# Patient Record
Sex: Male | Born: 1935 | Race: White | Hispanic: No | Marital: Married
Health system: Southern US, Community
[De-identification: ages and names within clinical notes are randomized; demographics above are authoritative.]

---

## 2006-10-20 ENCOUNTER — Ambulatory Visit: Payer: Self-pay | Admitting: Gastroenterology

## 2007-10-02 ENCOUNTER — Ambulatory Visit: Payer: Self-pay | Admitting: Family Medicine

## 2008-02-05 ENCOUNTER — Ambulatory Visit: Payer: Self-pay | Admitting: Cardiology

## 2008-04-12 ENCOUNTER — Ambulatory Visit: Payer: Self-pay | Admitting: Specialist

## 2008-09-05 ENCOUNTER — Encounter: Payer: Self-pay | Admitting: Specialist

## 2008-09-15 ENCOUNTER — Encounter: Payer: Self-pay | Admitting: Specialist

## 2008-10-16 ENCOUNTER — Encounter: Payer: Self-pay | Admitting: Specialist

## 2008-11-15 ENCOUNTER — Encounter: Payer: Self-pay | Admitting: Specialist

## 2010-09-27 ENCOUNTER — Observation Stay: Payer: Self-pay | Admitting: Internal Medicine

## 2011-08-09 ENCOUNTER — Inpatient Hospital Stay: Payer: Self-pay | Admitting: Internal Medicine

## 2012-07-09 ENCOUNTER — Ambulatory Visit: Payer: Self-pay | Admitting: Cardiology

## 2012-07-09 LAB — PROTIME-INR
INR: 1.2
Prothrombin Time: 15.6 secs — ABNORMAL HIGH (ref 11.5–14.7)

## 2013-03-15 ENCOUNTER — Inpatient Hospital Stay: Payer: Self-pay | Admitting: Family Medicine

## 2013-03-15 LAB — URINALYSIS, COMPLETE
Glucose,UR: NEGATIVE mg/dL (ref 0–75)
Nitrite: NEGATIVE
Ph: 5 (ref 4.5–8.0)
Protein: NEGATIVE
RBC,UR: 3 /HPF (ref 0–5)
Squamous Epithelial: 4

## 2013-03-15 LAB — CBC
HGB: 13 g/dL (ref 13.0–18.0)
MCHC: 34.4 g/dL (ref 32.0–36.0)
MCV: 104 fL — ABNORMAL HIGH (ref 80–100)
RBC: 3.65 10*6/uL — ABNORMAL LOW (ref 4.40–5.90)
RDW: 14.4 % (ref 11.5–14.5)

## 2013-03-15 LAB — PROTIME-INR
INR: 5.8
Prothrombin Time: 49.3 secs — ABNORMAL HIGH (ref 11.5–14.7)

## 2013-03-15 LAB — BASIC METABOLIC PANEL
Anion Gap: 8 (ref 7–16)
BUN: 19 mg/dL — ABNORMAL HIGH (ref 7–18)
Calcium, Total: 8.6 mg/dL (ref 8.5–10.1)
Chloride: 97 mmol/L — ABNORMAL LOW (ref 98–107)
EGFR (Non-African Amer.): 51 — ABNORMAL LOW
Glucose: 90 mg/dL (ref 65–99)
Potassium: 3.5 mmol/L (ref 3.5–5.1)
Sodium: 137 mmol/L (ref 136–145)

## 2013-03-15 LAB — PRO B NATRIURETIC PEPTIDE: B-Type Natriuretic Peptide: 4278 pg/mL — ABNORMAL HIGH (ref 0–450)

## 2013-03-15 LAB — TROPONIN I: Troponin-I: <0.02 ng/mL

## 2013-03-15 LAB — CK TOTAL AND CKMB (NOT AT ARMC)
CK, Total: 93 U/L (ref 35–232)
CK-MB: <0.5 ng/mL — ABNORMAL LOW (ref 0.5–3.6)

## 2013-03-16 LAB — CBC WITH DIFFERENTIAL/PLATELET
Basophil #: 0 10*3/uL (ref 0.0–0.1)
Basophil %: 0.3 %
Eosinophil %: 2.1 %
HCT: 31.7 % — ABNORMAL LOW (ref 40.0–52.0)
MCV: 105 fL — ABNORMAL HIGH (ref 80–100)
Monocyte #: 0.7 x10 3/mm (ref 0.2–1.0)
Neutrophil #: 4.2 10*3/uL (ref 1.4–6.5)
Platelet: 135 10*3/uL — ABNORMAL LOW (ref 150–440)
RBC: 3.03 10*6/uL — ABNORMAL LOW (ref 4.40–5.90)
RDW: 14.6 % — ABNORMAL HIGH (ref 11.5–14.5)
WBC: 5.6 10*3/uL (ref 3.8–10.6)

## 2013-03-16 LAB — BASIC METABOLIC PANEL
Calcium, Total: 8.2 mg/dL — ABNORMAL LOW (ref 8.5–10.1)
Creatinine: 1.06 mg/dL (ref 0.60–1.30)
EGFR (African American): >60
Potassium: 3.8 mmol/L (ref 3.5–5.1)
Sodium: 138 mmol/L (ref 136–145)

## 2013-03-16 LAB — PROTIME-INR: INR: 4.7

## 2013-03-17 LAB — CBC WITH DIFFERENTIAL/PLATELET
HCT: 31.3 % — ABNORMAL LOW (ref 40.0–52.0)
Lymphocyte %: 15.8 %
MCH: 35.5 pg — ABNORMAL HIGH (ref 26.0–34.0)
MCHC: 34.1 g/dL (ref 32.0–36.0)
MCV: 104 fL — ABNORMAL HIGH (ref 80–100)
Monocyte #: 0.6 x10 3/mm (ref 0.2–1.0)
Monocyte %: 12.5 %
Neutrophil #: 3.2 10*3/uL (ref 1.4–6.5)
Neutrophil %: 68.3 %
Platelet: 134 10*3/uL — ABNORMAL LOW (ref 150–440)
RBC: 3 10*6/uL — ABNORMAL LOW (ref 4.40–5.90)
WBC: 4.7 10*3/uL (ref 3.8–10.6)

## 2013-03-17 LAB — BASIC METABOLIC PANEL
BUN: 18 mg/dL (ref 7–18)
EGFR (Non-African Amer.): >60
Potassium: 3 mmol/L — ABNORMAL LOW (ref 3.5–5.1)
Sodium: 138 mmol/L (ref 136–145)

## 2013-03-17 LAB — PROTIME-INR: INR: 3.3

## 2013-03-17 LAB — MAGNESIUM: Magnesium: 0.9 mg/dL — ABNORMAL LOW

## 2013-03-18 LAB — PROTIME-INR
INR: 2
Prothrombin Time: 22.2 secs — ABNORMAL HIGH (ref 11.5–14.7)

## 2013-03-18 LAB — CBC WITH DIFFERENTIAL/PLATELET
Eosinophil %: 4.6 %
HGB: 10.9 g/dL — ABNORMAL LOW (ref 13.0–18.0)
Lymphocyte %: 20.4 %
MCH: 36.1 pg — ABNORMAL HIGH (ref 26.0–34.0)
MCHC: 34.6 g/dL (ref 32.0–36.0)
MCV: 104 fL — ABNORMAL HIGH (ref 80–100)
Neutrophil #: 3 10*3/uL (ref 1.4–6.5)
Platelet: 135 10*3/uL — ABNORMAL LOW (ref 150–440)
RBC: 3.03 10*6/uL — ABNORMAL LOW (ref 4.40–5.90)
RDW: 14.2 % (ref 11.5–14.5)

## 2013-03-19 LAB — BASIC METABOLIC PANEL
BUN: 15 mg/dL (ref 7–18)
Calcium, Total: 8.5 mg/dL (ref 8.5–10.1)
Creatinine: 0.92 mg/dL (ref 0.60–1.30)
EGFR (African American): >60
EGFR (Non-African Amer.): >60
Osmolality: 276 (ref 275–301)
Potassium: 3.4 mmol/L — ABNORMAL LOW (ref 3.5–5.1)

## 2013-03-19 LAB — PROTIME-INR: INR: 2.2

## 2013-03-21 LAB — CULTURE, BLOOD (SINGLE)

## 2014-03-01 ENCOUNTER — Observation Stay: Payer: Self-pay | Admitting: Internal Medicine

## 2014-03-01 LAB — CBC
HCT: 36.6 % — ABNORMAL LOW (ref 40.0–52.0)
HGB: 12.3 g/dL — AB (ref 13.0–18.0)
MCH: 35.1 pg — ABNORMAL HIGH (ref 26.0–34.0)
MCHC: 33.5 g/dL (ref 32.0–36.0)
MCV: 105 fL — ABNORMAL HIGH (ref 80–100)
PLATELETS: 134 10*3/uL — AB (ref 150–440)
RBC: 3.49 10*6/uL — AB (ref 4.40–5.90)
RDW: 15 % — ABNORMAL HIGH (ref 11.5–14.5)
WBC: 6 10*3/uL (ref 3.8–10.6)

## 2014-03-01 LAB — COMPREHENSIVE METABOLIC PANEL
Albumin: 3.1 g/dL — ABNORMAL LOW (ref 3.4–5.0)
Alkaline Phosphatase: 169 U/L — ABNORMAL HIGH
Anion Gap: 5 — ABNORMAL LOW (ref 7–16)
BUN: 23 mg/dL — ABNORMAL HIGH (ref 7–18)
Bilirubin,Total: 1.1 mg/dL — ABNORMAL HIGH (ref 0.2–1.0)
CO2: 32 mmol/L (ref 21–32)
Calcium, Total: 8.5 mg/dL (ref 8.5–10.1)
Chloride: 99 mmol/L (ref 98–107)
Creatinine: 1.23 mg/dL (ref 0.60–1.30)
EGFR (African American): >60
EGFR (Non-African Amer.): 55 — ABNORMAL LOW
Glucose: 91 mg/dL (ref 65–99)
Osmolality: 275 (ref 275–301)
Potassium: 3.6 mmol/L (ref 3.5–5.1)
SGOT(AST): 50 U/L — ABNORMAL HIGH (ref 15–37)
SGPT (ALT): 55 U/L (ref 12–78)
Sodium: 136 mmol/L (ref 136–145)
TOTAL PROTEIN: 6.8 g/dL (ref 6.4–8.2)

## 2014-03-01 LAB — ETHANOL: Ethanol: <3 mg/dL

## 2014-03-01 LAB — URINALYSIS, COMPLETE
Bilirubin,UR: NEGATIVE
Glucose,UR: NEGATIVE mg/dL (ref 0–75)
Nitrite: NEGATIVE
PROTEIN: NEGATIVE
Ph: 5 (ref 4.5–8.0)
RBC,UR: 7 /HPF (ref 0–5)
Specific Gravity: 1.013 (ref 1.003–1.030)
WBC UR: 16 /HPF (ref 0–5)

## 2014-03-01 LAB — LIPASE, BLOOD: Lipase: 126 U/L (ref 73–393)

## 2014-03-01 LAB — CK TOTAL AND CKMB (NOT AT ARMC)
CK, Total: 74 U/L
CK-MB: 0.8 ng/mL (ref 0.5–3.6)

## 2014-03-01 LAB — PROTIME-INR
INR: 4.9
Prothrombin Time: 44.2 secs — ABNORMAL HIGH (ref 11.5–14.7)

## 2014-03-01 LAB — PRO B NATRIURETIC PEPTIDE: B-Type Natriuretic Peptide: 2731 pg/mL — ABNORMAL HIGH (ref 0–450)

## 2014-03-01 LAB — TROPONIN I: Troponin-I: <0.02 ng/mL

## 2014-03-02 LAB — FOLATE: Folic Acid: 15.4 ng/mL (ref 3.1–100.0)

## 2014-03-03 LAB — PROTIME-INR
INR: 3.2
Prothrombin Time: 31.7 secs — ABNORMAL HIGH (ref 11.5–14.7)

## 2014-03-04 LAB — CBC WITH DIFFERENTIAL/PLATELET
BASOS ABS: 0 10*3/uL (ref 0.0–0.1)
BASOS PCT: 0.4 %
Eosinophil #: 0.4 10*3/uL (ref 0.0–0.7)
Eosinophil %: 8.9 %
HCT: 31.8 % — AB (ref 40.0–52.0)
HGB: 10.9 g/dL — AB (ref 13.0–18.0)
LYMPHS ABS: 1.1 10*3/uL (ref 1.0–3.6)
Lymphocyte %: 24.1 %
MCH: 36.2 pg — ABNORMAL HIGH (ref 26.0–34.0)
MCHC: 34.2 g/dL (ref 32.0–36.0)
MCV: 106 fL — ABNORMAL HIGH (ref 80–100)
Monocyte #: 0.6 x10 3/mm (ref 0.2–1.0)
Monocyte %: 14.2 %
Neutrophil #: 2.4 10*3/uL (ref 1.4–6.5)
Neutrophil %: 52.4 %
PLATELETS: 114 10*3/uL — AB (ref 150–440)
RBC: 3.01 10*6/uL — ABNORMAL LOW (ref 4.40–5.90)
RDW: 15.2 % — ABNORMAL HIGH (ref 11.5–14.5)
WBC: 4.5 10*3/uL (ref 3.8–10.6)

## 2014-03-04 LAB — PROTIME-INR
INR: 2.5
PROTHROMBIN TIME: 26 s — AB (ref 11.5–14.7)

## 2014-03-19 ENCOUNTER — Other Ambulatory Visit: Payer: Self-pay | Admitting: Family Medicine

## 2014-03-24 LAB — WOUND CULTURE

## 2014-09-06 ENCOUNTER — Inpatient Hospital Stay: Payer: Self-pay | Admitting: Internal Medicine

## 2014-09-06 LAB — COMPREHENSIVE METABOLIC PANEL
ALK PHOS: 194 U/L — AB
Albumin: 2.7 g/dL — ABNORMAL LOW (ref 3.4–5.0)
Anion Gap: 4 — ABNORMAL LOW (ref 7–16)
BUN: 21 mg/dL — AB (ref 7–18)
Bilirubin,Total: 1.9 mg/dL — ABNORMAL HIGH (ref 0.2–1.0)
CREATININE: 1.38 mg/dL — AB (ref 0.60–1.30)
Calcium, Total: 8.5 mg/dL (ref 8.5–10.1)
Chloride: 103 mmol/L (ref 98–107)
Co2: 29 mmol/L (ref 21–32)
EGFR (African American): 56 — ABNORMAL LOW
EGFR (Non-African Amer.): 48 — ABNORMAL LOW
GLUCOSE: 98 mg/dL (ref 65–99)
Osmolality: 275 (ref 275–301)
POTASSIUM: 3.6 mmol/L (ref 3.5–5.1)
SGOT(AST): 83 U/L — ABNORMAL HIGH (ref 15–37)
SGPT (ALT): 160 U/L — ABNORMAL HIGH
Sodium: 136 mmol/L (ref 136–145)
TOTAL PROTEIN: 7.2 g/dL (ref 6.4–8.2)

## 2014-09-06 LAB — CBC WITH DIFFERENTIAL/PLATELET
BASOS ABS: 0 10*3/uL (ref 0.0–0.1)
Basophil %: 0.4 %
EOS ABS: 0.2 10*3/uL (ref 0.0–0.7)
Eosinophil %: 2.4 %
HCT: 38 % — ABNORMAL LOW (ref 40.0–52.0)
HGB: 12.3 g/dL — ABNORMAL LOW (ref 13.0–18.0)
Lymphocyte #: 1.1 10*3/uL (ref 1.0–3.6)
Lymphocyte %: 15.4 %
MCH: 33.7 pg (ref 26.0–34.0)
MCHC: 32.3 g/dL (ref 32.0–36.0)
MCV: 104 fL — ABNORMAL HIGH (ref 80–100)
MONOS PCT: 9.3 %
Monocyte #: 0.7 x10 3/mm (ref 0.2–1.0)
Neutrophil #: 5.1 10*3/uL (ref 1.4–6.5)
Neutrophil %: 72.5 %
Platelet: 138 10*3/uL — ABNORMAL LOW (ref 150–440)
RBC: 3.65 10*6/uL — ABNORMAL LOW (ref 4.40–5.90)
RDW: 14.6 % — ABNORMAL HIGH (ref 11.5–14.5)
WBC: 7.1 10*3/uL (ref 3.8–10.6)

## 2014-09-06 LAB — URINALYSIS, COMPLETE
Bacteria: NONE SEEN
Bilirubin,UR: NEGATIVE
Blood: NEGATIVE
Glucose,UR: NEGATIVE mg/dL (ref 0–75)
Hyaline Cast: 6
KETONE: NEGATIVE
NITRITE: NEGATIVE
Ph: 5 (ref 4.5–8.0)
Protein: NEGATIVE
RBC,UR: 1 /HPF (ref 0–5)
Specific Gravity: 1.014 (ref 1.003–1.030)
Squamous Epithelial: NONE SEEN

## 2014-09-06 LAB — LIPASE, BLOOD: LIPASE: 2011 U/L — AB (ref 73–393)

## 2014-09-06 LAB — LIPID PANEL
CHOLESTEROL: 140 mg/dL (ref 0–200)
HDL: 31 mg/dL — AB (ref 40–60)
Ldl Cholesterol, Calc: 81 mg/dL (ref 0–100)
Triglycerides: 139 mg/dL (ref 0–200)
VLDL CHOLESTEROL, CALC: 28 mg/dL (ref 5–40)

## 2014-09-06 LAB — TROPONIN I

## 2014-09-06 LAB — PROTIME-INR
INR: 1.7
PROTHROMBIN TIME: 19.2 s — AB (ref 11.5–14.7)

## 2014-09-07 LAB — COMPREHENSIVE METABOLIC PANEL
ALBUMIN: 2.3 g/dL — AB (ref 3.4–5.0)
ALT: 103 U/L — AB
ANION GAP: 6 — AB (ref 7–16)
AST: 44 U/L — AB (ref 15–37)
Alkaline Phosphatase: 157 U/L — ABNORMAL HIGH
BILIRUBIN TOTAL: 1.7 mg/dL — AB (ref 0.2–1.0)
BUN: 19 mg/dL — ABNORMAL HIGH (ref 7–18)
CALCIUM: 8.3 mg/dL — AB (ref 8.5–10.1)
CO2: 28 mmol/L (ref 21–32)
Chloride: 104 mmol/L (ref 98–107)
Creatinine: 1.01 mg/dL (ref 0.60–1.30)
EGFR (African American): >60
EGFR (Non-African Amer.): >60
GLUCOSE: 76 mg/dL (ref 65–99)
OSMOLALITY: 277 (ref 275–301)
Potassium: 3 mmol/L — ABNORMAL LOW (ref 3.5–5.1)
Sodium: 138 mmol/L (ref 136–145)
Total Protein: 6.2 g/dL — ABNORMAL LOW (ref 6.4–8.2)

## 2014-09-07 LAB — PROTIME-INR
INR: 1.7
Prothrombin Time: 19.2 secs — ABNORMAL HIGH (ref 11.5–14.7)

## 2014-09-07 LAB — MAGNESIUM: Magnesium: 1.7 mg/dL — ABNORMAL LOW

## 2014-09-07 LAB — LIPASE, BLOOD: Lipase: 1173 U/L — ABNORMAL HIGH (ref 73–393)

## 2014-09-08 LAB — COMPREHENSIVE METABOLIC PANEL
ALK PHOS: 151 U/L — AB
ALT: 83 U/L — AB
Albumin: 2.3 g/dL — ABNORMAL LOW (ref 3.4–5.0)
Anion Gap: 8 (ref 7–16)
BILIRUBIN TOTAL: 1.4 mg/dL — AB (ref 0.2–1.0)
BUN: 14 mg/dL (ref 7–18)
CALCIUM: 8.4 mg/dL — AB (ref 8.5–10.1)
Chloride: 109 mmol/L — ABNORMAL HIGH (ref 98–107)
Co2: 26 mmol/L (ref 21–32)
Creatinine: 0.92 mg/dL (ref 0.60–1.30)
EGFR (African American): >60
EGFR (Non-African Amer.): >60
Glucose: 64 mg/dL — ABNORMAL LOW (ref 65–99)
Osmolality: 284 (ref 275–301)
POTASSIUM: 3.6 mmol/L (ref 3.5–5.1)
SGOT(AST): 33 U/L (ref 15–37)
SODIUM: 143 mmol/L (ref 136–145)
Total Protein: 6.4 g/dL (ref 6.4–8.2)

## 2014-09-08 LAB — PROTIME-INR
INR: 1.9
Prothrombin Time: 21.3 secs — ABNORMAL HIGH (ref 11.5–14.7)

## 2014-09-08 LAB — LIPASE, BLOOD: Lipase: 875 U/L — ABNORMAL HIGH (ref 73–393)

## 2014-09-09 LAB — COMPREHENSIVE METABOLIC PANEL
Anion Gap: 7 (ref 7–16)
BUN: 15 mg/dL (ref 7–18)
Calcium, Total: 8.6 mg/dL (ref 8.5–10.1)
Chloride: 108 mmol/L — ABNORMAL HIGH (ref 98–107)
Co2: 25 mmol/L (ref 21–32)
Creatinine: 0.99 mg/dL (ref 0.60–1.30)
EGFR (Non-African Amer.): >60
GLUCOSE: 103 mg/dL — AB (ref 65–99)
Osmolality: 280 (ref 275–301)
POTASSIUM: 3.3 mmol/L — AB (ref 3.5–5.1)
SODIUM: 140 mmol/L (ref 136–145)

## 2014-09-09 LAB — CBC WITH DIFFERENTIAL/PLATELET
Basophil #: 0 10*3/uL (ref 0.0–0.1)
Basophil %: 0.4 %
EOS ABS: 0.2 10*3/uL (ref 0.0–0.7)
EOS PCT: 4 %
HCT: 34 % — ABNORMAL LOW (ref 40.0–52.0)
HGB: 11.4 g/dL — ABNORMAL LOW (ref 13.0–18.0)
LYMPHS ABS: 0.9 10*3/uL — AB (ref 1.0–3.6)
Lymphocyte %: 15.9 %
MCH: 35 pg — ABNORMAL HIGH (ref 26.0–34.0)
MCHC: 33.6 g/dL (ref 32.0–36.0)
MCV: 104 fL — ABNORMAL HIGH (ref 80–100)
MONO ABS: 0.6 x10 3/mm (ref 0.2–1.0)
MONOS PCT: 10.6 %
NEUTROS ABS: 3.8 10*3/uL (ref 1.4–6.5)
Neutrophil %: 69.1 %
PLATELETS: 132 10*3/uL — AB (ref 150–440)
RBC: 3.26 10*6/uL — ABNORMAL LOW (ref 4.40–5.90)
RDW: 14.9 % — AB (ref 11.5–14.5)
WBC: 5.5 10*3/uL (ref 3.8–10.6)

## 2014-09-09 LAB — HEPATIC FUNCTION PANEL A (ARMC)
AST: 25 U/L (ref 15–37)
Albumin: 2.4 g/dL — ABNORMAL LOW (ref 3.4–5.0)
Alkaline Phosphatase: 142 U/L — ABNORMAL HIGH
BILIRUBIN TOTAL: 1.6 mg/dL — AB (ref 0.2–1.0)
Bilirubin, Direct: 0.7 mg/dL — ABNORMAL HIGH (ref 0.00–0.20)
SGPT (ALT): 64 U/L — ABNORMAL HIGH
TOTAL PROTEIN: 6.3 g/dL — AB (ref 6.4–8.2)

## 2014-09-09 LAB — PROTIME-INR
INR: 1.4
Prothrombin Time: 16.6 secs — ABNORMAL HIGH (ref 11.5–14.7)

## 2014-09-09 LAB — LIPASE, BLOOD: Lipase: 807 U/L — ABNORMAL HIGH (ref 73–393)

## 2014-09-10 LAB — COMPREHENSIVE METABOLIC PANEL
ALBUMIN: 2.5 g/dL — AB (ref 3.4–5.0)
ALK PHOS: 146 U/L — AB
AST: 55 U/L — AB (ref 15–37)
Anion Gap: 4 — ABNORMAL LOW (ref 7–16)
BUN: 13 mg/dL (ref 7–18)
Bilirubin,Total: 1.7 mg/dL — ABNORMAL HIGH (ref 0.2–1.0)
Calcium, Total: 8.2 mg/dL — ABNORMAL LOW (ref 8.5–10.1)
Chloride: 109 mmol/L — ABNORMAL HIGH (ref 98–107)
Co2: 24 mmol/L (ref 21–32)
Creatinine: 0.7 mg/dL (ref 0.60–1.30)
EGFR (African American): >60
EGFR (Non-African Amer.): >60
Glucose: 130 mg/dL — ABNORMAL HIGH (ref 65–99)
OSMOLALITY: 276 (ref 275–301)
Potassium: 5.8 mmol/L — ABNORMAL HIGH (ref 3.5–5.1)
SGPT (ALT): 68 U/L — ABNORMAL HIGH
Sodium: 137 mmol/L (ref 136–145)
Total Protein: 6.4 g/dL (ref 6.4–8.2)

## 2014-09-10 LAB — CBC WITH DIFFERENTIAL/PLATELET
BASOS PCT: 0.2 %
Basophil #: 0 10*3/uL (ref 0.0–0.1)
EOS ABS: 0 10*3/uL (ref 0.0–0.7)
EOS PCT: 0.1 %
HCT: 36.6 % — ABNORMAL LOW (ref 40.0–52.0)
HGB: 11.8 g/dL — ABNORMAL LOW (ref 13.0–18.0)
LYMPHS PCT: 9 %
Lymphocyte #: 0.9 10*3/uL — ABNORMAL LOW (ref 1.0–3.6)
MCH: 34.3 pg — AB (ref 26.0–34.0)
MCHC: 32.2 g/dL (ref 32.0–36.0)
MCV: 106 fL — AB (ref 80–100)
Monocyte #: 0.5 x10 3/mm (ref 0.2–1.0)
Monocyte %: 5.1 %
Neutrophil #: 8.1 10*3/uL — ABNORMAL HIGH (ref 1.4–6.5)
Neutrophil %: 85.6 %
Platelet: 141 10*3/uL — ABNORMAL LOW (ref 150–440)
RBC: 3.44 10*6/uL — ABNORMAL LOW (ref 4.40–5.90)
RDW: 14.7 % — ABNORMAL HIGH (ref 11.5–14.5)
WBC: 9.4 10*3/uL (ref 3.8–10.6)

## 2014-09-10 LAB — URINE CULTURE

## 2014-09-10 LAB — POTASSIUM: Potassium: 4.3 mmol/L (ref 3.5–5.1)

## 2014-09-11 LAB — BASIC METABOLIC PANEL
Anion Gap: 5 — ABNORMAL LOW (ref 7–16)
BUN: 13 mg/dL (ref 7–18)
CALCIUM: 9.1 mg/dL (ref 8.5–10.1)
Chloride: 106 mmol/L (ref 98–107)
Co2: 27 mmol/L (ref 21–32)
Creatinine: 0.98 mg/dL (ref 0.60–1.30)
EGFR (African American): >60
EGFR (Non-African Amer.): >60
Glucose: 129 mg/dL — ABNORMAL HIGH (ref 65–99)
Osmolality: 277 (ref 275–301)
Potassium: 4 mmol/L (ref 3.5–5.1)
Sodium: 138 mmol/L (ref 136–145)

## 2014-09-12 LAB — BASIC METABOLIC PANEL
Anion Gap: 6 — ABNORMAL LOW (ref 7–16)
BUN: 12 mg/dL (ref 7–18)
CALCIUM: 8.5 mg/dL (ref 8.5–10.1)
Chloride: 109 mmol/L — ABNORMAL HIGH (ref 98–107)
Co2: 26 mmol/L (ref 21–32)
Creatinine: 0.95 mg/dL (ref 0.60–1.30)
EGFR (African American): >60
Glucose: 107 mg/dL — ABNORMAL HIGH (ref 65–99)
OSMOLALITY: 281 (ref 275–301)
POTASSIUM: 3.7 mmol/L (ref 3.5–5.1)
SODIUM: 141 mmol/L (ref 136–145)

## 2014-09-12 LAB — CBC WITH DIFFERENTIAL/PLATELET
Basophil #: 0 10*3/uL (ref 0.0–0.1)
Basophil %: 0.1 %
Eosinophil #: 0 10*3/uL (ref 0.0–0.7)
Eosinophil %: 0.1 %
HCT: 34.1 % — AB (ref 40.0–52.0)
HGB: 11.1 g/dL — AB (ref 13.0–18.0)
LYMPHS PCT: 6.6 %
Lymphocyte #: 0.9 10*3/uL — ABNORMAL LOW (ref 1.0–3.6)
MCH: 34.2 pg — ABNORMAL HIGH (ref 26.0–34.0)
MCHC: 32.7 g/dL (ref 32.0–36.0)
MCV: 105 fL — ABNORMAL HIGH (ref 80–100)
Monocyte #: 1.5 x10 3/mm — ABNORMAL HIGH (ref 0.2–1.0)
Monocyte %: 11.3 %
NEUTROS PCT: 81.9 %
Neutrophil #: 10.9 10*3/uL — ABNORMAL HIGH (ref 1.4–6.5)
Platelet: 177 10*3/uL (ref 150–440)
RBC: 3.26 10*6/uL — ABNORMAL LOW (ref 4.40–5.90)
RDW: 14.3 % (ref 11.5–14.5)
WBC: 13.2 10*3/uL — ABNORMAL HIGH (ref 3.8–10.6)

## 2014-09-12 LAB — HEPATIC FUNCTION PANEL A (ARMC)
AST: 14 U/L — AB (ref 15–37)
Albumin: 2.1 g/dL — ABNORMAL LOW (ref 3.4–5.0)
Alkaline Phosphatase: 104 U/L
BILIRUBIN DIRECT: 1.4 mg/dL — AB (ref 0.00–0.20)
Bilirubin,Total: 2.4 mg/dL — ABNORMAL HIGH (ref 0.2–1.0)
SGPT (ALT): 33 U/L
TOTAL PROTEIN: 6.1 g/dL — AB (ref 6.4–8.2)

## 2014-09-12 LAB — PATHOLOGY REPORT

## 2014-09-12 LAB — LIPASE, BLOOD: Lipase: 104 U/L (ref 73–393)

## 2014-09-13 LAB — BASIC METABOLIC PANEL
Anion Gap: 6 — ABNORMAL LOW (ref 7–16)
BUN: 14 mg/dL (ref 7–18)
CREATININE: 0.99 mg/dL (ref 0.60–1.30)
Calcium, Total: 8.6 mg/dL (ref 8.5–10.1)
Chloride: 112 mmol/L — ABNORMAL HIGH (ref 98–107)
Co2: 26 mmol/L (ref 21–32)
EGFR (African American): >60
Glucose: 93 mg/dL (ref 65–99)
Osmolality: 287 (ref 275–301)
Potassium: 3.7 mmol/L (ref 3.5–5.1)
Sodium: 144 mmol/L (ref 136–145)

## 2014-09-13 LAB — CBC WITH DIFFERENTIAL/PLATELET
Basophil #: 0 10*3/uL (ref 0.0–0.1)
Basophil %: 0.1 %
Eosinophil #: 0 10*3/uL (ref 0.0–0.7)
Eosinophil %: 0.3 %
HCT: 33.5 % — ABNORMAL LOW (ref 40.0–52.0)
HGB: 10.9 g/dL — AB (ref 13.0–18.0)
LYMPHS ABS: 0.9 10*3/uL — AB (ref 1.0–3.6)
Lymphocyte %: 6.7 %
MCH: 34.6 pg — AB (ref 26.0–34.0)
MCHC: 32.5 g/dL (ref 32.0–36.0)
MCV: 106 fL — ABNORMAL HIGH (ref 80–100)
MONO ABS: 1 x10 3/mm (ref 0.2–1.0)
Monocyte %: 7.8 %
NEUTROS PCT: 85.1 %
Neutrophil #: 10.8 10*3/uL — ABNORMAL HIGH (ref 1.4–6.5)
Platelet: 179 10*3/uL (ref 150–440)
RBC: 3.15 10*6/uL — ABNORMAL LOW (ref 4.40–5.90)
RDW: 14.3 % (ref 11.5–14.5)
WBC: 12.7 10*3/uL — AB (ref 3.8–10.6)

## 2014-09-14 LAB — HEPATIC FUNCTION PANEL A (ARMC)
ALT: 24 U/L
Albumin: 1.8 g/dL — ABNORMAL LOW (ref 3.4–5.0)
Alkaline Phosphatase: 91 U/L
BILIRUBIN DIRECT: 0.6 mg/dL — AB (ref 0.00–0.20)
Bilirubin,Total: 1.2 mg/dL — ABNORMAL HIGH (ref 0.2–1.0)
SGOT(AST): 18 U/L (ref 15–37)
TOTAL PROTEIN: 5.7 g/dL — AB (ref 6.4–8.2)

## 2014-09-14 LAB — BASIC METABOLIC PANEL
Anion Gap: 9 (ref 7–16)
BUN: 15 mg/dL (ref 7–18)
CALCIUM: 8.3 mg/dL — AB (ref 8.5–10.1)
CO2: 26 mmol/L (ref 21–32)
Chloride: 109 mmol/L — ABNORMAL HIGH (ref 98–107)
Creatinine: 0.99 mg/dL (ref 0.60–1.30)
EGFR (African American): >60
EGFR (Non-African Amer.): >60
Glucose: 83 mg/dL (ref 65–99)
Osmolality: 287 (ref 275–301)
Potassium: 3.5 mmol/L (ref 3.5–5.1)
SODIUM: 144 mmol/L (ref 136–145)

## 2014-09-14 LAB — CBC WITH DIFFERENTIAL/PLATELET
BASOS PCT: 0.1 %
Basophil #: 0 10*3/uL (ref 0.0–0.1)
EOS ABS: 0.1 10*3/uL (ref 0.0–0.7)
Eosinophil %: 0.8 %
HCT: 31.6 % — ABNORMAL LOW (ref 40.0–52.0)
HGB: 10.4 g/dL — ABNORMAL LOW (ref 13.0–18.0)
LYMPHS ABS: 1 10*3/uL (ref 1.0–3.6)
Lymphocyte %: 7 %
MCH: 34.3 pg — AB (ref 26.0–34.0)
MCHC: 32.8 g/dL (ref 32.0–36.0)
MCV: 105 fL — ABNORMAL HIGH (ref 80–100)
Monocyte #: 1 x10 3/mm (ref 0.2–1.0)
Monocyte %: 6.9 %
NEUTROS ABS: 12 10*3/uL — AB (ref 1.4–6.5)
NEUTROS PCT: 85.2 %
Platelet: 202 10*3/uL (ref 150–440)
RBC: 3.03 10*6/uL — ABNORMAL LOW (ref 4.40–5.90)
RDW: 14.2 % (ref 11.5–14.5)
WBC: 14.1 10*3/uL — AB (ref 3.8–10.6)

## 2014-09-15 ENCOUNTER — Ambulatory Visit: Payer: Self-pay | Admitting: Internal Medicine

## 2014-09-15 LAB — CBC WITH DIFFERENTIAL/PLATELET
Basophil #: 0 10*3/uL (ref 0.0–0.1)
Basophil %: 0.4 %
EOS ABS: 0.2 10*3/uL (ref 0.0–0.7)
EOS PCT: 1.3 %
HCT: 33 % — AB (ref 40.0–52.0)
HGB: 10.9 g/dL — ABNORMAL LOW (ref 13.0–18.0)
Lymphocyte #: 1.2 10*3/uL (ref 1.0–3.6)
Lymphocyte %: 9.4 %
MCH: 34.3 pg — AB (ref 26.0–34.0)
MCHC: 33.1 g/dL (ref 32.0–36.0)
MCV: 104 fL — AB (ref 80–100)
MONOS PCT: 7 %
Monocyte #: 0.9 x10 3/mm (ref 0.2–1.0)
NEUTROS ABS: 10.3 10*3/uL — AB (ref 1.4–6.5)
NEUTROS PCT: 81.9 %
PLATELETS: 215 10*3/uL (ref 150–440)
RBC: 3.18 10*6/uL — AB (ref 4.40–5.90)
RDW: 14.2 % (ref 11.5–14.5)
WBC: 12.6 10*3/uL — ABNORMAL HIGH (ref 3.8–10.6)

## 2014-09-15 LAB — HEPATIC FUNCTION PANEL A (ARMC)
ALT: 23 U/L
Albumin: 1.8 g/dL — ABNORMAL LOW (ref 3.4–5.0)
Alkaline Phosphatase: 96 U/L
BILIRUBIN DIRECT: 0.6 mg/dL — AB (ref 0.00–0.20)
Bilirubin,Total: 1.2 mg/dL — ABNORMAL HIGH (ref 0.2–1.0)
SGOT(AST): 22 U/L (ref 15–37)
Total Protein: 5.9 g/dL — ABNORMAL LOW (ref 6.4–8.2)

## 2014-09-15 LAB — BASIC METABOLIC PANEL
Anion Gap: 6 — ABNORMAL LOW (ref 7–16)
BUN: 17 mg/dL (ref 7–18)
CHLORIDE: 104 mmol/L (ref 98–107)
Calcium, Total: 8.1 mg/dL — ABNORMAL LOW (ref 8.5–10.1)
Co2: 30 mmol/L (ref 21–32)
Creatinine: 1.06 mg/dL (ref 0.60–1.30)
EGFR (African American): >60
Glucose: 96 mg/dL (ref 65–99)
Osmolality: 281 (ref 275–301)
POTASSIUM: 3.1 mmol/L — AB (ref 3.5–5.1)
SODIUM: 140 mmol/L (ref 136–145)

## 2014-09-20 ENCOUNTER — Inpatient Hospital Stay: Payer: Self-pay | Admitting: Surgery

## 2014-09-20 LAB — URINALYSIS, COMPLETE
BILIRUBIN, UR: NEGATIVE
Bacteria: NONE SEEN
Glucose,UR: NEGATIVE mg/dL (ref 0–75)
Ketone: NEGATIVE
Nitrite: NEGATIVE
Ph: 6 (ref 4.5–8.0)
Protein: NEGATIVE
SPECIFIC GRAVITY: 1.017 (ref 1.003–1.030)
Squamous Epithelial: NONE SEEN
WBC UR: 13 /HPF (ref 0–5)

## 2014-09-20 LAB — COMPREHENSIVE METABOLIC PANEL
ALBUMIN: 2.3 g/dL — AB (ref 3.4–5.0)
ALT: 22 U/L
ANION GAP: 5 — AB (ref 7–16)
AST: 18 U/L (ref 15–37)
Alkaline Phosphatase: 103 U/L
BUN: 19 mg/dL — ABNORMAL HIGH (ref 7–18)
Bilirubin,Total: 0.7 mg/dL (ref 0.2–1.0)
CHLORIDE: 99 mmol/L (ref 98–107)
CO2: 33 mmol/L — AB (ref 21–32)
Calcium, Total: 8.6 mg/dL (ref 8.5–10.1)
Creatinine: 1.29 mg/dL (ref 0.60–1.30)
EGFR (Non-African Amer.): 57 — ABNORMAL LOW
GLUCOSE: 97 mg/dL (ref 65–99)
Osmolality: 276 (ref 275–301)
POTASSIUM: 3.5 mmol/L (ref 3.5–5.1)
Sodium: 137 mmol/L (ref 136–145)
Total Protein: 5.8 g/dL — ABNORMAL LOW (ref 6.4–8.2)

## 2014-09-20 LAB — CBC
HCT: 35.7 % — ABNORMAL LOW (ref 40.0–52.0)
HGB: 11.5 g/dL — AB (ref 13.0–18.0)
MCH: 33.5 pg (ref 26.0–34.0)
MCHC: 32.3 g/dL (ref 32.0–36.0)
MCV: 104 fL — ABNORMAL HIGH (ref 80–100)
Platelet: 255 10*3/uL (ref 150–440)
RBC: 3.44 10*6/uL — AB (ref 4.40–5.90)
RDW: 14 % (ref 11.5–14.5)
WBC: 8.8 10*3/uL (ref 3.8–10.6)

## 2014-09-20 LAB — CLOSTRIDIUM DIFFICILE(ARMC)

## 2014-09-20 LAB — PROTIME-INR
INR: 1.3
Prothrombin Time: 15.9 secs — ABNORMAL HIGH (ref 11.5–14.7)

## 2014-09-21 LAB — PROTIME-INR
INR: 1.4
PROTHROMBIN TIME: 16.8 s — AB (ref 11.5–14.7)

## 2014-09-22 LAB — PROTIME-INR
INR: 1.6
PROTHROMBIN TIME: 19.1 s — AB (ref 11.5–14.7)

## 2014-09-23 LAB — COMPREHENSIVE METABOLIC PANEL
ALT: 15 U/L
Albumin: 2.1 g/dL — ABNORMAL LOW (ref 3.4–5.0)
Alkaline Phosphatase: 79 U/L
Anion Gap: 5 — ABNORMAL LOW (ref 7–16)
BILIRUBIN TOTAL: 0.5 mg/dL (ref 0.2–1.0)
BUN: 11 mg/dL (ref 7–18)
CALCIUM: 8.3 mg/dL — AB (ref 8.5–10.1)
CO2: 35 mmol/L — AB (ref 21–32)
Chloride: 102 mmol/L (ref 98–107)
Creatinine: 1.28 mg/dL (ref 0.60–1.30)
EGFR (African American): >60
EGFR (Non-African Amer.): 57 — ABNORMAL LOW
GLUCOSE: 98 mg/dL (ref 65–99)
OSMOLALITY: 282 (ref 275–301)
POTASSIUM: 3.2 mmol/L — AB (ref 3.5–5.1)
SGOT(AST): 16 U/L (ref 15–37)
Sodium: 142 mmol/L (ref 136–145)
Total Protein: 5.9 g/dL — ABNORMAL LOW (ref 6.4–8.2)

## 2014-09-23 LAB — CBC WITH DIFFERENTIAL/PLATELET
BASOS PCT: 0.6 %
Basophil #: 0 10*3/uL (ref 0.0–0.1)
EOS ABS: 0.3 10*3/uL (ref 0.0–0.7)
EOS PCT: 6.2 %
HCT: 31.7 % — AB (ref 40.0–52.0)
HGB: 10.7 g/dL — ABNORMAL LOW (ref 13.0–18.0)
LYMPHS PCT: 26.8 %
Lymphocyte #: 1.5 10*3/uL (ref 1.0–3.6)
MCH: 34.3 pg — AB (ref 26.0–34.0)
MCHC: 33.8 g/dL (ref 32.0–36.0)
MCV: 102 fL — ABNORMAL HIGH (ref 80–100)
MONOS PCT: 10.1 %
Monocyte #: 0.5 x10 3/mm (ref 0.2–1.0)
NEUTROS PCT: 56.3 %
Neutrophil #: 3 10*3/uL (ref 1.4–6.5)
Platelet: 239 10*3/uL (ref 150–440)
RBC: 3.12 10*6/uL — ABNORMAL LOW (ref 4.40–5.90)
RDW: 13.9 % (ref 11.5–14.5)
WBC: 5.4 10*3/uL (ref 3.8–10.6)

## 2014-09-23 LAB — PROTIME-INR
INR: 1.9
PROTHROMBIN TIME: 21.7 s — AB (ref 11.5–14.7)

## 2014-09-24 LAB — PROTIME-INR
INR: 2.2
Prothrombin Time: 24.2 secs — ABNORMAL HIGH (ref 11.5–14.7)

## 2014-09-25 LAB — CULTURE, BLOOD (SINGLE)

## 2014-09-25 LAB — PROTIME-INR
INR: 2.2
Prothrombin Time: 24.1 secs — ABNORMAL HIGH (ref 11.5–14.7)

## 2014-09-26 LAB — PROTIME-INR
INR: 2.4
Prothrombin Time: 25.9 secs — ABNORMAL HIGH (ref 11.5–14.7)

## 2014-09-26 LAB — HEMOGLOBIN: HGB: 11.4 g/dL — ABNORMAL LOW (ref 13.0–18.0)

## 2014-09-27 LAB — PROTIME-INR
INR: 2.5
Prothrombin Time: 26.1 secs — ABNORMAL HIGH (ref 11.5–14.7)

## 2014-10-03 ENCOUNTER — Inpatient Hospital Stay: Payer: Self-pay | Admitting: Internal Medicine

## 2014-10-03 LAB — COMPREHENSIVE METABOLIC PANEL
ALT: 20 U/L
Albumin: 2.7 g/dL — ABNORMAL LOW (ref 3.4–5.0)
Alkaline Phosphatase: 122 U/L — ABNORMAL HIGH
Anion Gap: 15 (ref 7–16)
BUN: 34 mg/dL — ABNORMAL HIGH (ref 7–18)
Bilirubin,Total: 1.1 mg/dL — ABNORMAL HIGH (ref 0.2–1.0)
CALCIUM: 9.6 mg/dL (ref 8.5–10.1)
CO2: 23 mmol/L (ref 21–32)
Chloride: 99 mmol/L (ref 98–107)
Creatinine: 2.71 mg/dL — ABNORMAL HIGH (ref 0.60–1.30)
EGFR (African American): 29 — ABNORMAL LOW
EGFR (Non-African Amer.): 24 — ABNORMAL LOW
Glucose: 152 mg/dL — ABNORMAL HIGH (ref 65–99)
Osmolality: 284 (ref 275–301)
Potassium: 4.7 mmol/L (ref 3.5–5.1)
SGOT(AST): 41 U/L — ABNORMAL HIGH (ref 15–37)
Sodium: 137 mmol/L (ref 136–145)
Total Protein: 7.4 g/dL (ref 6.4–8.2)

## 2014-10-03 LAB — CBC
HCT: 36.3 % — AB (ref 40.0–52.0)
HGB: 11.7 g/dL — AB (ref 13.0–18.0)
MCH: 33.2 pg (ref 26.0–34.0)
MCHC: 32.2 g/dL (ref 32.0–36.0)
MCV: 103 fL — ABNORMAL HIGH (ref 80–100)
Platelet: 348 10*3/uL (ref 150–440)
RBC: 3.52 10*6/uL — AB (ref 4.40–5.90)
RDW: 14.7 % — AB (ref 11.5–14.5)
WBC: 24.7 10*3/uL — ABNORMAL HIGH (ref 3.8–10.6)

## 2014-10-03 LAB — OCCULT BLOOD X 1 CARD TO LAB, STOOL: OCCULT BLOOD, FECES: POSITIVE

## 2014-10-03 LAB — LIPASE, BLOOD: Lipase: 241 U/L (ref 73–393)

## 2014-10-03 LAB — PROTIME-INR
INR: 2.2
PROTHROMBIN TIME: 24.2 s — AB (ref 11.5–14.7)

## 2014-10-03 LAB — TROPONIN I: Troponin-I: <0.02 ng/mL

## 2014-10-03 LAB — CLOSTRIDIUM DIFFICILE(ARMC)

## 2014-10-04 LAB — CBC WITH DIFFERENTIAL/PLATELET
BASOS ABS: 0.1 10*3/uL (ref 0.0–0.1)
BASOS ABS: 0 10*3/uL (ref 0.0–0.1)
BASOS PCT: 0.1 %
Basophil #: 0 10*3/uL (ref 0.0–0.1)
Basophil %: 0.3 %
Basophil %: 0.3 %
EOS ABS: 0 10*3/uL (ref 0.0–0.7)
EOS ABS: 0 10*3/uL (ref 0.0–0.7)
EOS PCT: 0 %
EOS PCT: 0.1 %
Eosinophil #: 0 10*3/uL (ref 0.0–0.7)
Eosinophil %: 0 %
HCT: 26.9 % — ABNORMAL LOW (ref 40.0–52.0)
HCT: 26.8 % — ABNORMAL LOW (ref 40.0–52.0)
HCT: 24.7 % — ABNORMAL LOW (ref 40.0–52.0)
HGB: 8.8 g/dL — ABNORMAL LOW (ref 13.0–18.0)
HGB: 8.5 g/dL — AB (ref 13.0–18.0)
HGB: 7.9 g/dL — AB (ref 13.0–18.0)
LYMPHS PCT: 6.4 %
Lymphocyte #: 1 10*3/uL (ref 1.0–3.6)
Lymphocyte #: 1 10*3/uL (ref 1.0–3.6)
Lymphocyte #: 1 10*3/uL (ref 1.0–3.6)
Lymphocyte %: 5 %
Lymphocyte %: 4.9 %
MCH: 33.4 pg (ref 26.0–34.0)
MCH: 32.3 pg (ref 26.0–34.0)
MCH: 32.7 pg (ref 26.0–34.0)
MCHC: 32.6 g/dL (ref 32.0–36.0)
MCHC: 31.6 g/dL — ABNORMAL LOW (ref 32.0–36.0)
MCHC: 31.9 g/dL — ABNORMAL LOW (ref 32.0–36.0)
MCV: 102 fL — AB (ref 80–100)
MCV: 102 fL — AB (ref 80–100)
MCV: 103 fL — ABNORMAL HIGH (ref 80–100)
MONO ABS: 1.1 x10 3/mm — AB (ref 0.2–1.0)
MONO ABS: 1.4 x10 3/mm — AB (ref 0.2–1.0)
Monocyte #: 1.1 x10 3/mm — ABNORMAL HIGH (ref 0.2–1.0)
Monocyte %: 5.4 %
Monocyte %: 7 %
Monocyte %: 7.2 %
NEUTROS ABS: 17.3 10*3/uL — AB (ref 1.4–6.5)
NEUTROS ABS: 12.9 10*3/uL — AB (ref 1.4–6.5)
NEUTROS PCT: 89.5 %
NEUTROS PCT: 86.1 %
Neutrophil #: 18.3 10*3/uL — ABNORMAL HIGH (ref 1.4–6.5)
Neutrophil %: 87.7 %
PLATELETS: 169 10*3/uL (ref 150–440)
Platelet: 215 10*3/uL (ref 150–440)
Platelet: 205 10*3/uL (ref 150–440)
RBC: 2.63 10*6/uL — ABNORMAL LOW (ref 4.40–5.90)
RBC: 2.62 10*6/uL — AB (ref 4.40–5.90)
RBC: 2.41 10*6/uL — ABNORMAL LOW (ref 4.40–5.90)
RDW: 14.9 % — AB (ref 11.5–14.5)
RDW: 14.7 % — AB (ref 11.5–14.5)
RDW: 14.8 % — AB (ref 11.5–14.5)
WBC: 20.4 10*3/uL — ABNORMAL HIGH (ref 3.8–10.6)
WBC: 19.8 10*3/uL — AB (ref 3.8–10.6)
WBC: 15 10*3/uL — AB (ref 3.8–10.6)

## 2014-10-04 LAB — URINALYSIS, COMPLETE
Bacteria: NONE SEEN
Bilirubin,UR: NEGATIVE
Glucose,UR: NEGATIVE mg/dL (ref 0–75)
KETONE: NEGATIVE
Nitrite: NEGATIVE
Ph: 5 (ref 4.5–8.0)
RBC,UR: 1211 /HPF (ref 0–5)
SPECIFIC GRAVITY: 1.018 (ref 1.003–1.030)
Squamous Epithelial: NONE SEEN
WBC UR: 72 /HPF (ref 0–5)

## 2014-10-04 LAB — BASIC METABOLIC PANEL
Anion Gap: 12 (ref 7–16)
BUN: 39 mg/dL — ABNORMAL HIGH (ref 7–18)
CALCIUM: 7.3 mg/dL — AB (ref 8.5–10.1)
CO2: 23 mmol/L (ref 21–32)
CREATININE: 2.96 mg/dL — AB (ref 0.60–1.30)
Chloride: 106 mmol/L (ref 98–107)
EGFR (African American): 26 — ABNORMAL LOW
EGFR (Non-African Amer.): 22 — ABNORMAL LOW
GLUCOSE: 160 mg/dL — AB (ref 65–99)
Osmolality: 294 (ref 275–301)
POTASSIUM: 4.1 mmol/L (ref 3.5–5.1)
Sodium: 141 mmol/L (ref 136–145)

## 2014-10-04 LAB — PROTIME-INR
INR: 2.1
Prothrombin Time: 23 secs — ABNORMAL HIGH (ref 11.5–14.7)

## 2014-10-04 LAB — MAGNESIUM: MAGNESIUM: 1.8 mg/dL

## 2014-10-04 LAB — PHOSPHORUS: Phosphorus: 3.7 mg/dL (ref 2.5–4.9)

## 2014-10-05 DIAGNOSIS — I509 Heart failure, unspecified: Secondary | ICD-10-CM

## 2014-10-05 LAB — CBC WITH DIFFERENTIAL/PLATELET
BASOS PCT: 0.1 %
Basophil #: 0 10*3/uL (ref 0.0–0.1)
EOS ABS: 0 10*3/uL (ref 0.0–0.7)
Eosinophil %: 0 %
HCT: 24.3 % — AB (ref 40.0–52.0)
HGB: 8.1 g/dL — AB (ref 13.0–18.0)
LYMPHS PCT: 7.8 %
Lymphocyte #: 1.1 10*3/uL (ref 1.0–3.6)
MCH: 32.5 pg (ref 26.0–34.0)
MCHC: 33.3 g/dL (ref 32.0–36.0)
MCV: 98 fL (ref 80–100)
MONOS PCT: 9.4 %
Monocyte #: 1.3 x10 3/mm — ABNORMAL HIGH (ref 0.2–1.0)
NEUTROS PCT: 82.7 %
Neutrophil #: 11.7 10*3/uL — ABNORMAL HIGH (ref 1.4–6.5)
Platelet: 156 10*3/uL (ref 150–440)
RBC: 2.49 10*6/uL — ABNORMAL LOW (ref 4.40–5.90)
RDW: 16.7 % — ABNORMAL HIGH (ref 11.5–14.5)
WBC: 14.1 10*3/uL — ABNORMAL HIGH (ref 3.8–10.6)

## 2014-10-05 LAB — PROTIME-INR
INR: 1.4
PROTHROMBIN TIME: 16.8 s — AB (ref 11.5–14.7)

## 2014-10-05 LAB — BASIC METABOLIC PANEL
ANION GAP: 12 (ref 7–16)
BUN: 44 mg/dL — ABNORMAL HIGH (ref 7–18)
CHLORIDE: 108 mmol/L — AB (ref 98–107)
Calcium, Total: 7.5 mg/dL — ABNORMAL LOW (ref 8.5–10.1)
Co2: 21 mmol/L (ref 21–32)
Creatinine: 2.67 mg/dL — ABNORMAL HIGH (ref 0.60–1.30)
EGFR (African American): 30 — ABNORMAL LOW
EGFR (Non-African Amer.): 25 — ABNORMAL LOW
GLUCOSE: 147 mg/dL — AB (ref 65–99)
Osmolality: 295 (ref 275–301)
POTASSIUM: 3.9 mmol/L (ref 3.5–5.1)
Sodium: 141 mmol/L (ref 136–145)

## 2014-10-05 LAB — HEMOGLOBIN: HGB: 7.1 g/dL — AB (ref 13.0–18.0)

## 2014-10-05 LAB — PHOSPHORUS: Phosphorus: 3 mg/dL (ref 2.5–4.9)

## 2014-10-06 LAB — BASIC METABOLIC PANEL
Anion Gap: 10 (ref 7–16)
BUN: 36 mg/dL — ABNORMAL HIGH (ref 7–18)
CHLORIDE: 111 mmol/L — AB (ref 98–107)
Calcium, Total: 7.7 mg/dL — ABNORMAL LOW (ref 8.5–10.1)
Co2: 23 mmol/L (ref 21–32)
Creatinine: 1.95 mg/dL — ABNORMAL HIGH (ref 0.60–1.30)
GFR CALC AF AMER: 43 — AB
GFR CALC NON AF AMER: 35 — AB
Glucose: 106 mg/dL — ABNORMAL HIGH (ref 65–99)
Osmolality: 296 (ref 275–301)
Potassium: 3.6 mmol/L (ref 3.5–5.1)
Sodium: 144 mmol/L (ref 136–145)

## 2014-10-06 LAB — HEMOGLOBIN: HGB: 7.1 g/dL — AB (ref 13.0–18.0)

## 2014-10-06 LAB — WBC: WBC: 8.6 10*3/uL (ref 3.8–10.6)

## 2014-10-06 LAB — CULTURE, BLOOD (SINGLE)

## 2014-10-07 LAB — PROTEIN / CREATININE RATIO, URINE
Creatinine, Urine: 74.8 mg/dL (ref 30.0–125.0)
PROTEIN, RANDOM URINE: 39 mg/dL — AB (ref 0–12)
Protein/Creat. Ratio: 521 mg/gCREAT — ABNORMAL HIGH (ref 0–200)

## 2014-10-08 LAB — CULTURE, BLOOD (SINGLE)

## 2014-10-09 LAB — MISC AER/ANAEROBIC CULT.

## 2014-10-11 LAB — CULTURE, BLOOD (SINGLE)

## 2014-10-16 ENCOUNTER — Ambulatory Visit: Payer: Self-pay | Admitting: Internal Medicine

## 2014-10-16 DEATH — deceased

## 2014-11-23 IMAGING — CT CT ABD-PELV W/O CM
2 of 4 series · 16 of 46 positions shown, 18 images · non-contrast
Comparison: None.

CLINICAL DATA: Altered mental status and respiratory distress. Open
cholecystectomy 3 weeks ago. Septic shock.

EXAM:
CT ABDOMEN AND PELVIS WITHOUT CONTRAST
TECHNIQUE: Multidetector CT imaging of the abdomen and pelvis was performed
following the standard protocol without IV contrast.

[Series 2: routine abd pel without · axial · non-contrast · 0.93mm/px · z∈[-396,+74]mm · 13 of 104 slices shown, 15 images]
[im 5/104  soft-tissue]
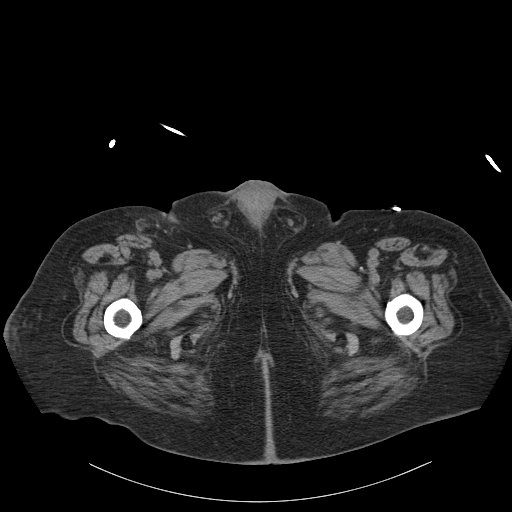
[im 5/104  bone]
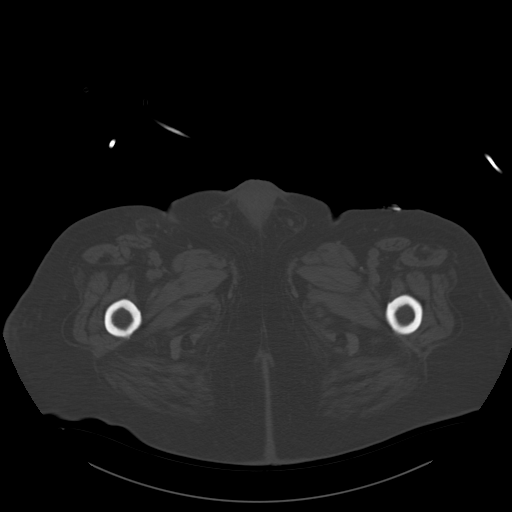
[im 13/104  soft-tissue]
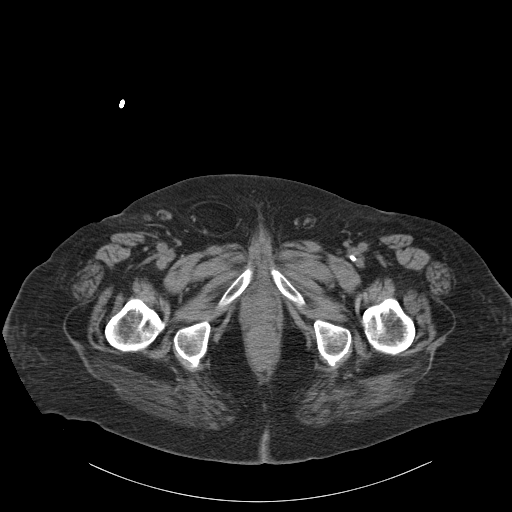
[im 21/104  soft-tissue]
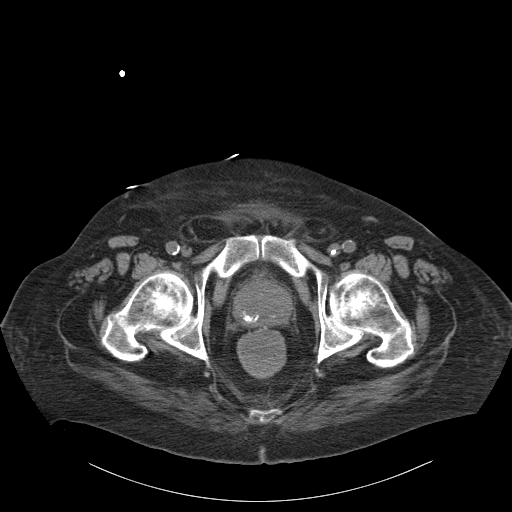
[im 29/104  soft-tissue]
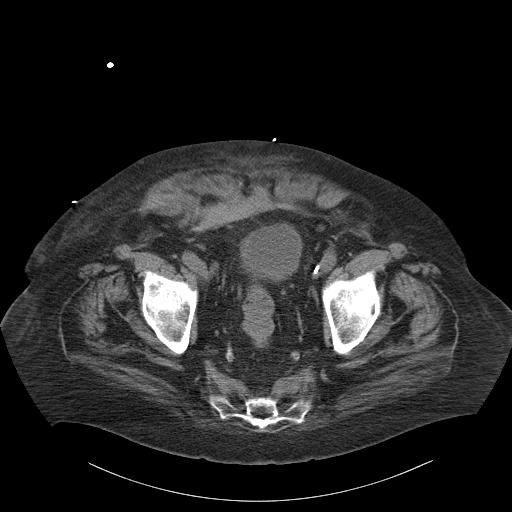
[im 38/104  soft-tissue]
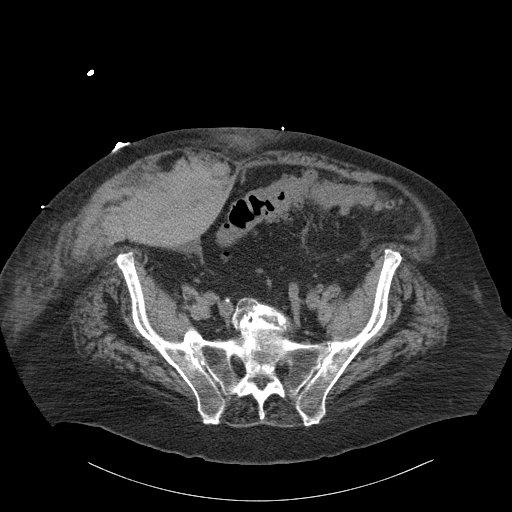
[im 46/104  soft-tissue]
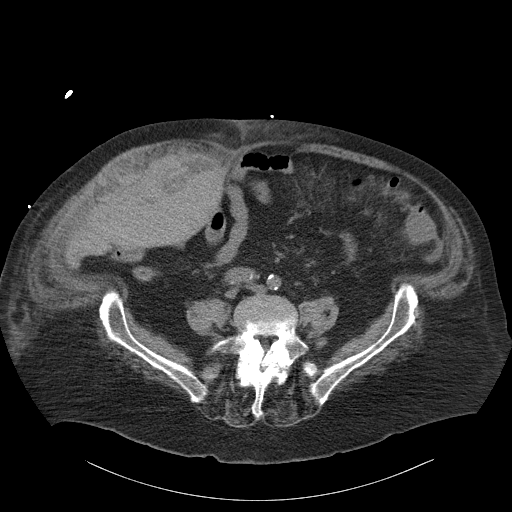
[im 54/104  soft-tissue]
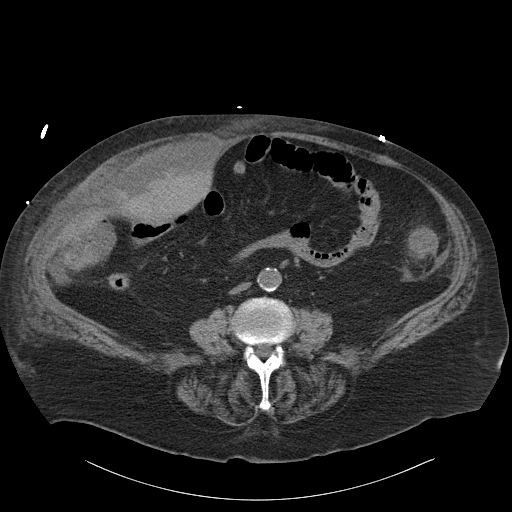
[im 58/104  soft-tissue]
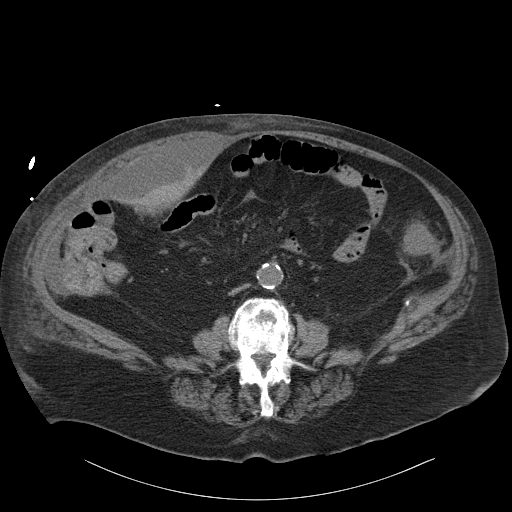
[im 66/104  soft-tissue]
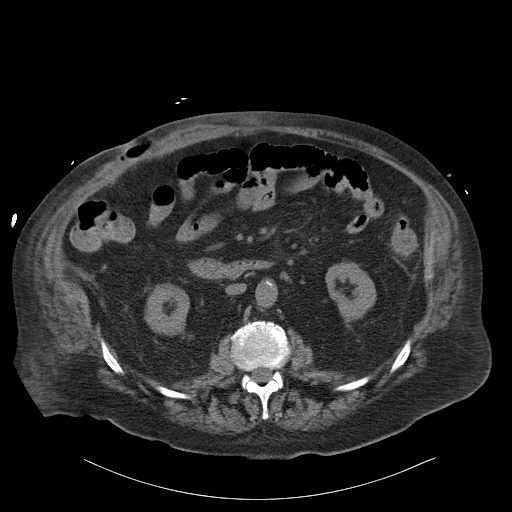
[im 66/104  bone]
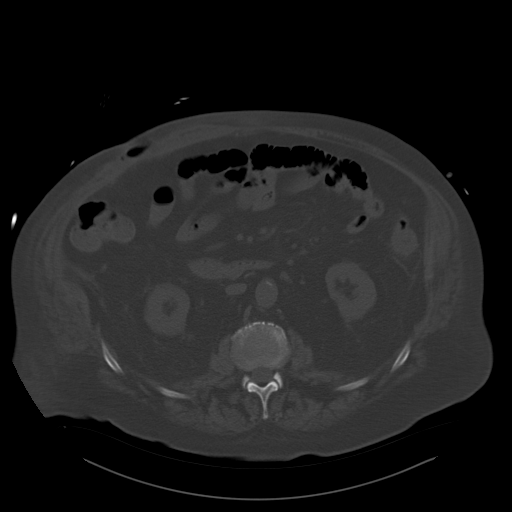
[im 75/104  soft-tissue]
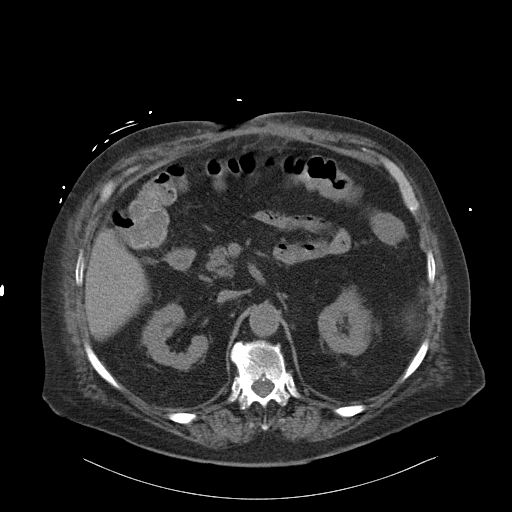
[im 83/104  soft-tissue]
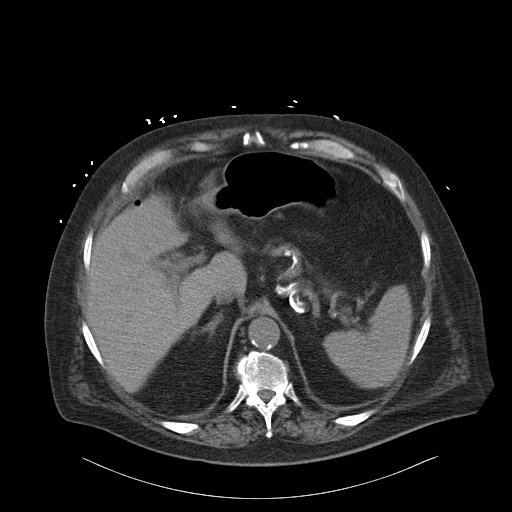
[im 91/104  soft-tissue]
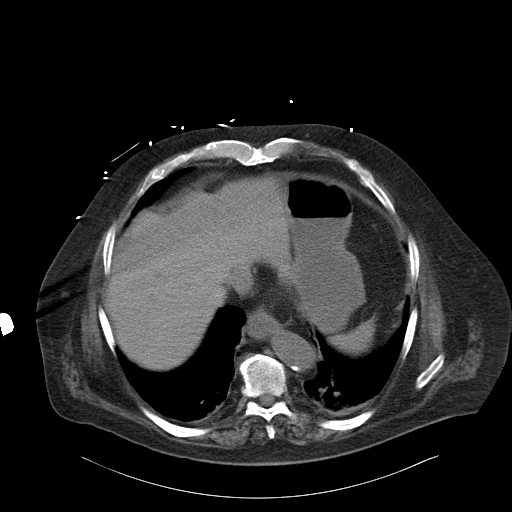
[im 99/104  soft-tissue]
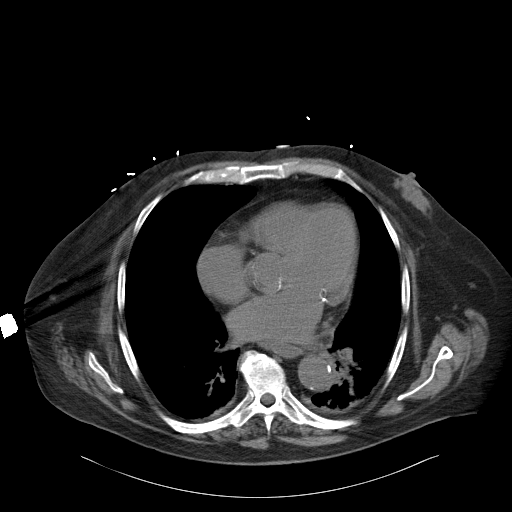

[Series 5: cor routine abd pel wo · coronal · 0.96mm/px · 3 of 157 slices shown]
[im 53/157  soft-tissue]
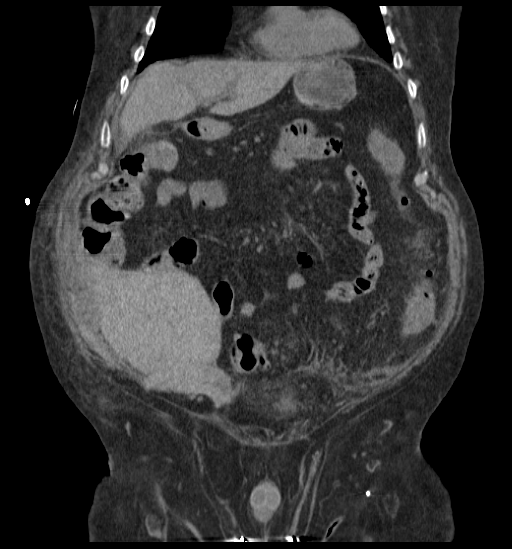
[im 70/157  soft-tissue]
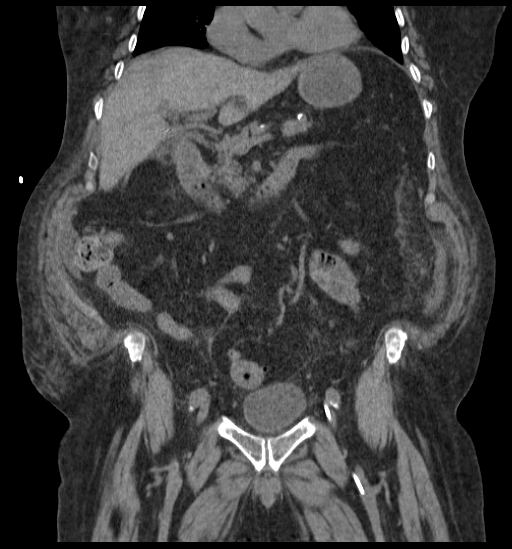
[im 87/157  soft-tissue]
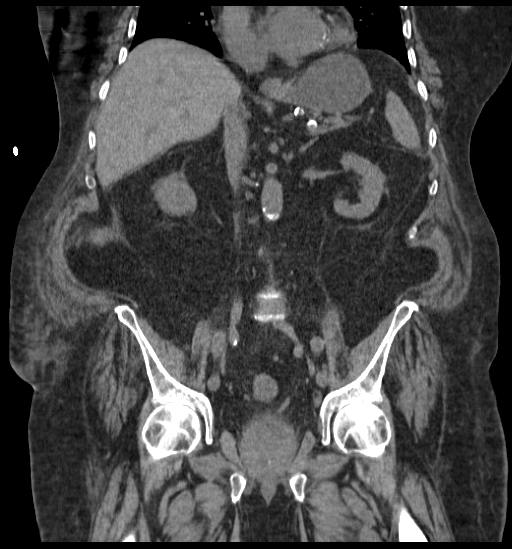

[16 of 46 positions shown; findings below may reference images not displayed]

FINDINGS: Atelectasis in the lung bases.  Coronary artery calcifications.

Surgical absence of the gallbladder. Infiltration in the gallbladder
fossa and small amount of gas consistent with recent surgery.
Surgical skin defect along the right upper quadrant. The unenhanced
appearance of the liver, spleen, pancreas, adrenal glands, kidneys,
inferior vena cava, and retroperitoneal lymph nodes is unremarkable.
Calcification of the abdominal aorta with mild ectasia. Maximal AP
diameter of the aorta is 2.6 cm. Stomach, small bowel, and colon are
decompressed. There is wall thickening and pericolonic infiltration
around the descending and sigmoid colon consistent with colitis.
This is likely infectious or inflammatory in etiology.
Diverticulitis is less likely due to long segment of involvement.

Pelvis: There is a large hematoma in the right rectus abdominis
sheath, measuring 21.8 by 18.4 x 9.8 cm. Small amount of free fluid
adjacent to the cecum is likely reactive. Prostate gland is enlarged
and contains calcification. Diverticula throughout the sigmoid
colon. No bladder wall thickening. Appendix is not identified. No
loculated pelvic fluid collections. Spondylolysis with moderate
spondylolisthesis at L5-S1. Compression fractures of L1 and L3
vertebrae. No destructive bone lesions appreciated. Right inguinal
hernia containing fat.
IMPRESSION: Postoperative changes consistent with history of recent coli
cystectomy. No abscess identified in the gallbladder fossa. Large
right rectus sheath hematoma. Inflammatory changes around the
descending and sigmoid colon likely representing infectious or
inflammatory colitis.

## 2014-11-24 IMAGING — US ULTRASOUND CORE BIOPSY
1 series · 13 of 16 positions shown · non-contrast
Comparison: CT abdomen pelvis - 10/05/2014

MEDICATIONS:
None.

COMPLICATIONS:
None immediate

INDICATION: History of cholecystectomy, now with septic shock and CT findings
demonstrating a large right rectus sheath hematoma. Please perform
ultrasound-guided fluid aspiration for diagnostic purposes.

EXAM:
ULTRASOUND-GUIDED FLUID ASPIRATION
TECHNIQUE: Informed written consent was obtained from the patient after a
discussion of the risks, benefits and alternatives to treatment. A
timeout was performed prior to the initiation of the procedure.

[Series 1: ultrasound core biopsy · 0.13mm/px · 16 acquisitions, 13 frames shown]
[im 1/16]
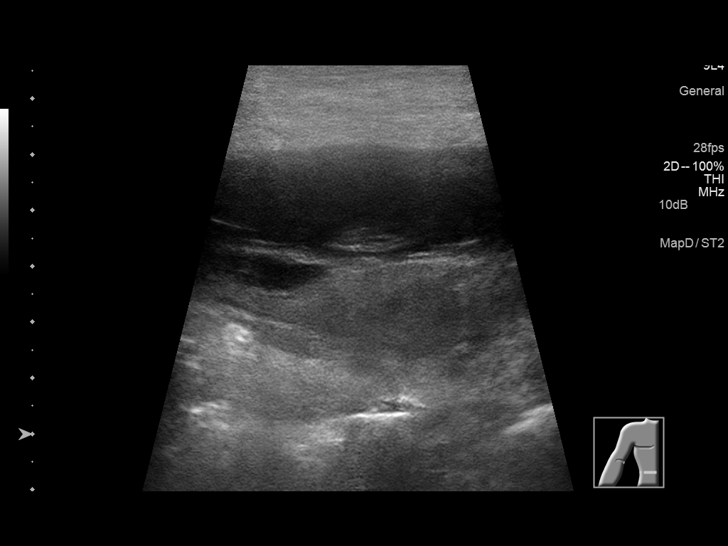
[im 2/16]
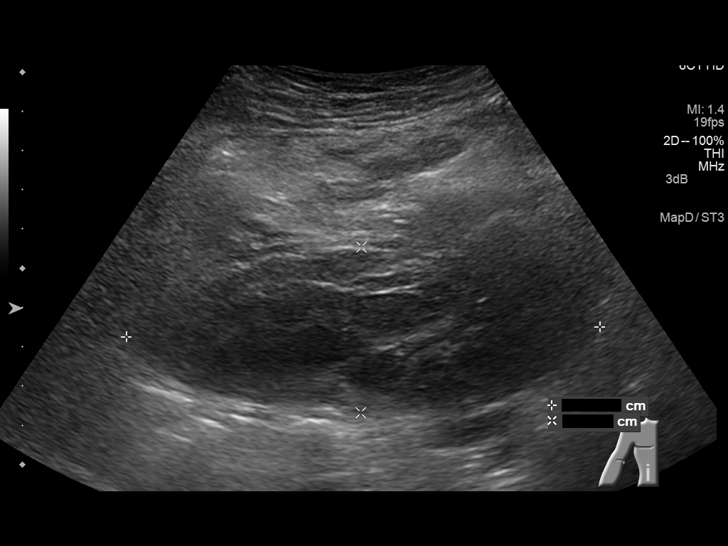
[im 4/16]
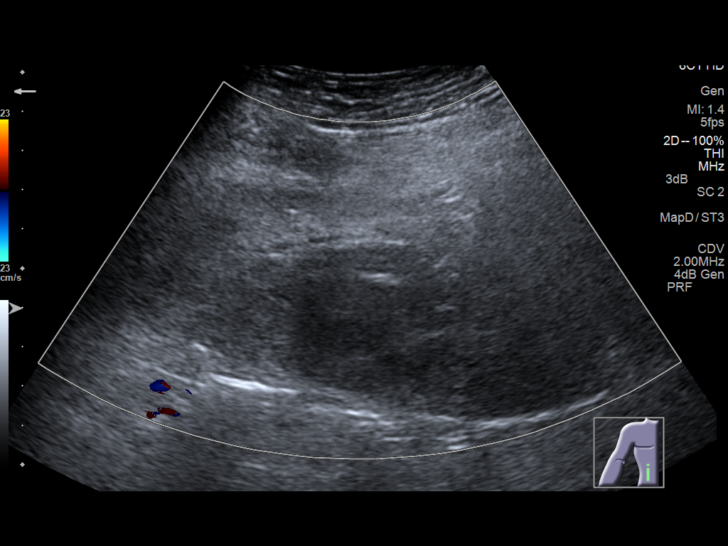
[im 5/16]
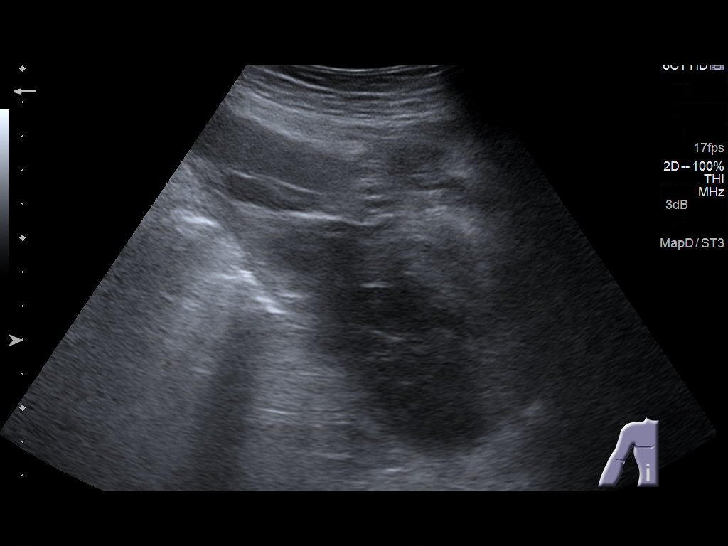
[im 6/16]
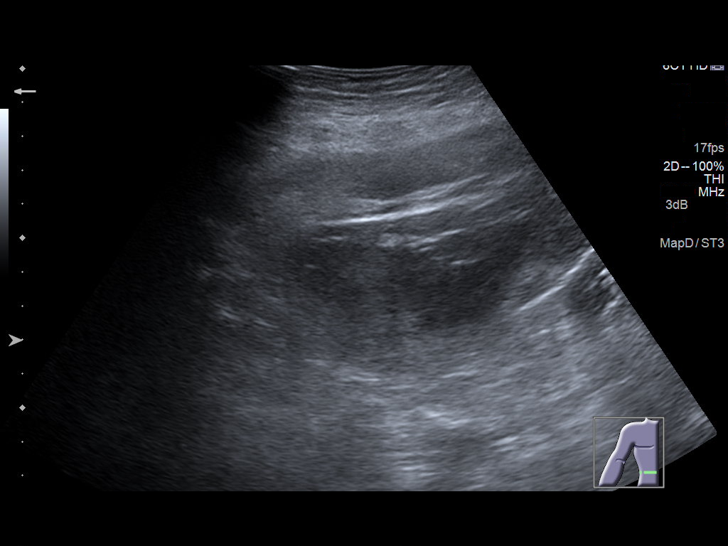
[im 7/16]
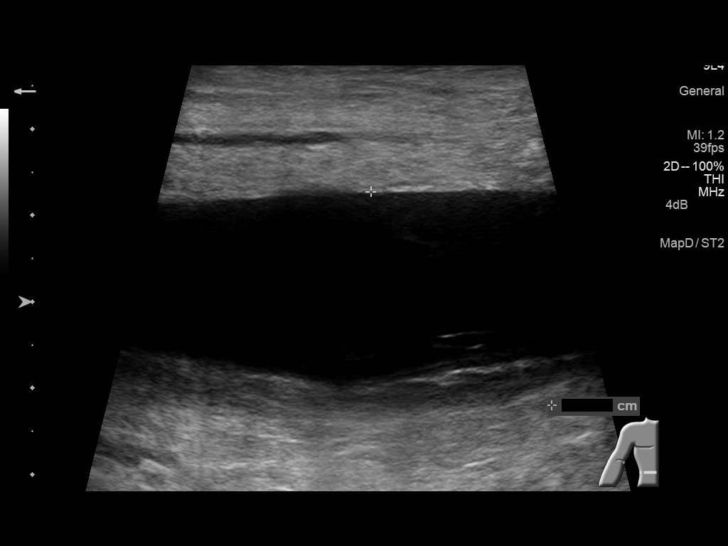
[im 9/16]
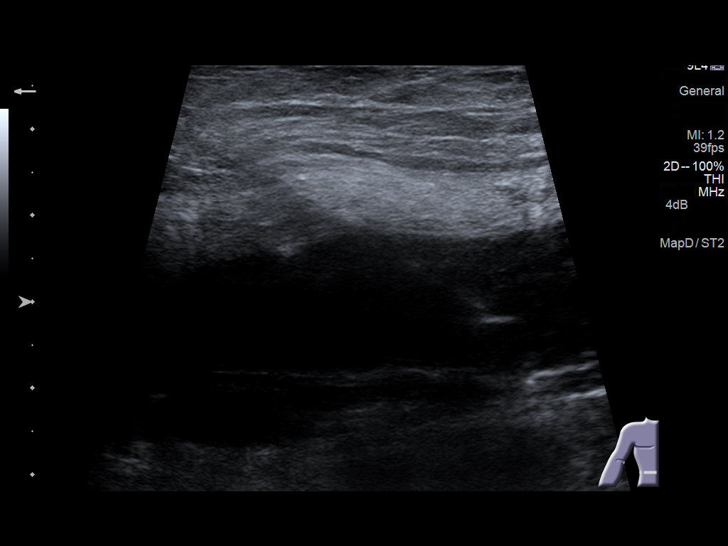
[im 10/16]
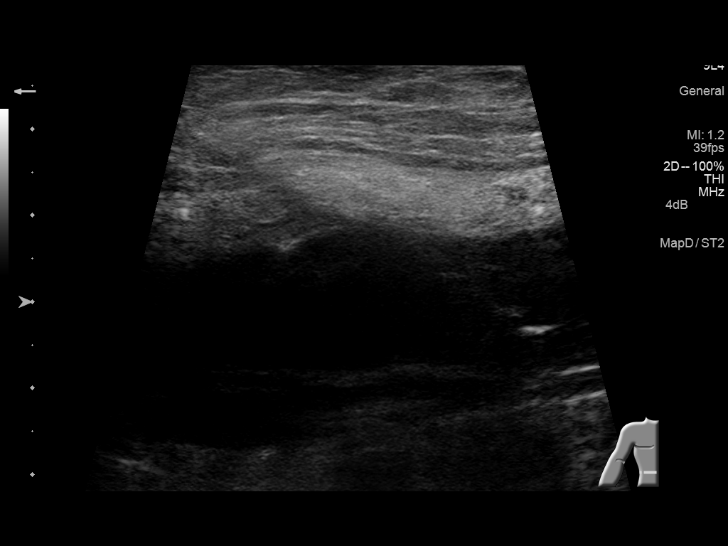
[im 11/16]
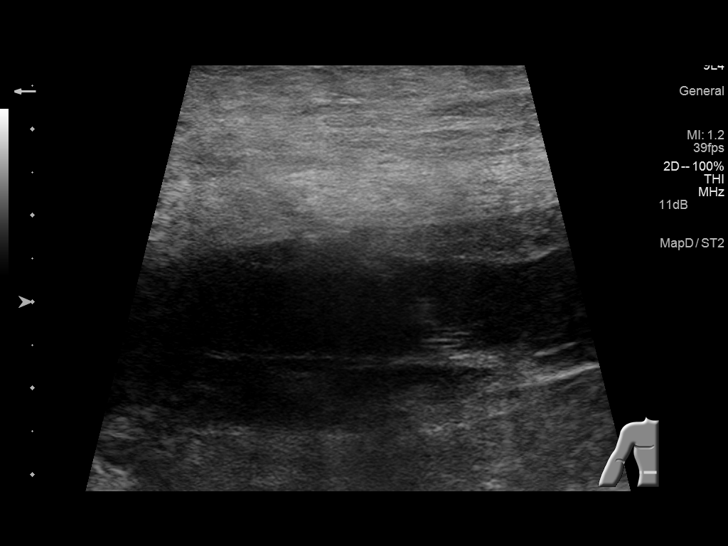
[im 12/16]
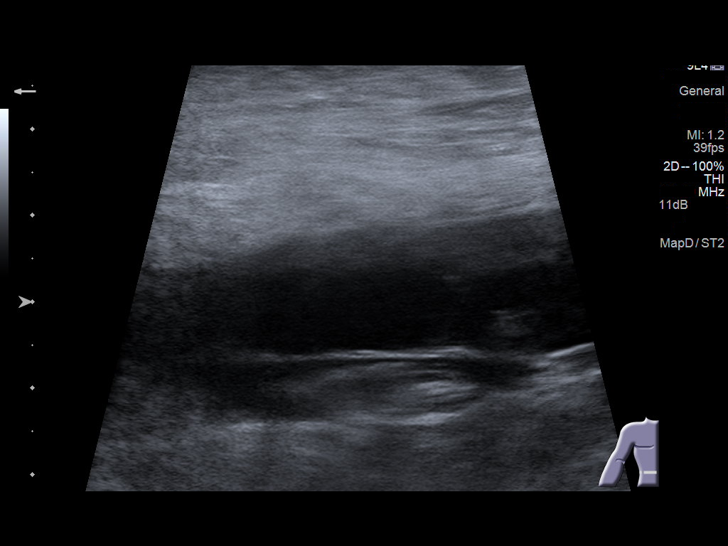
[im 13/16]
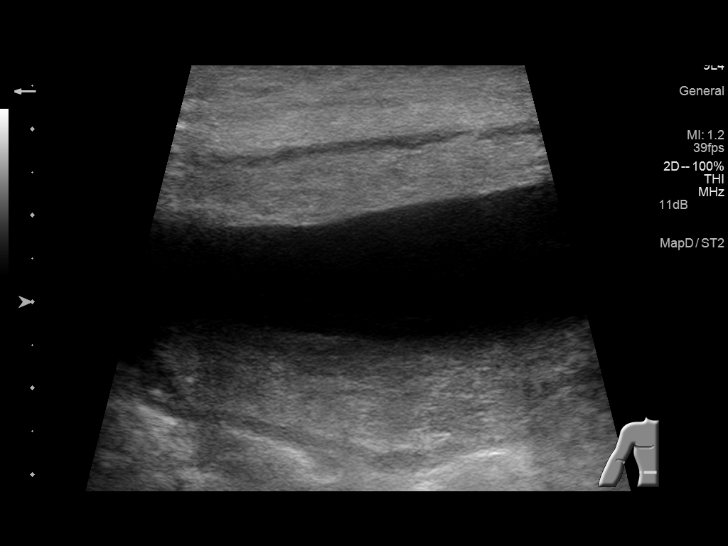
[im 15/16]
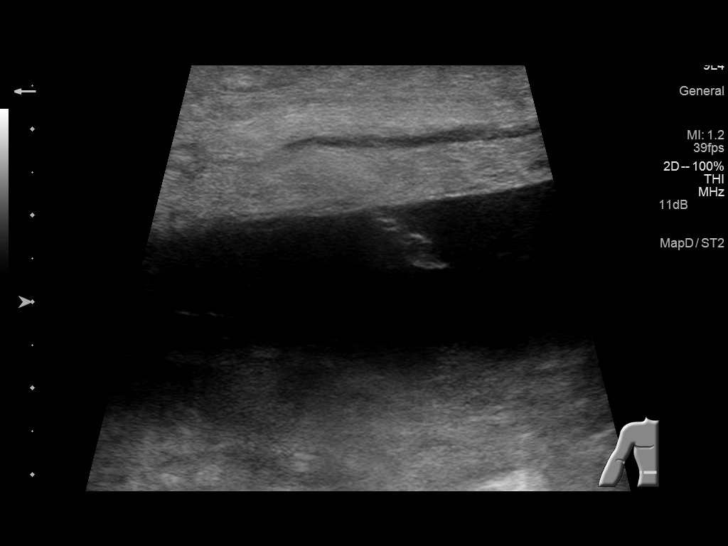
[im 16/16]
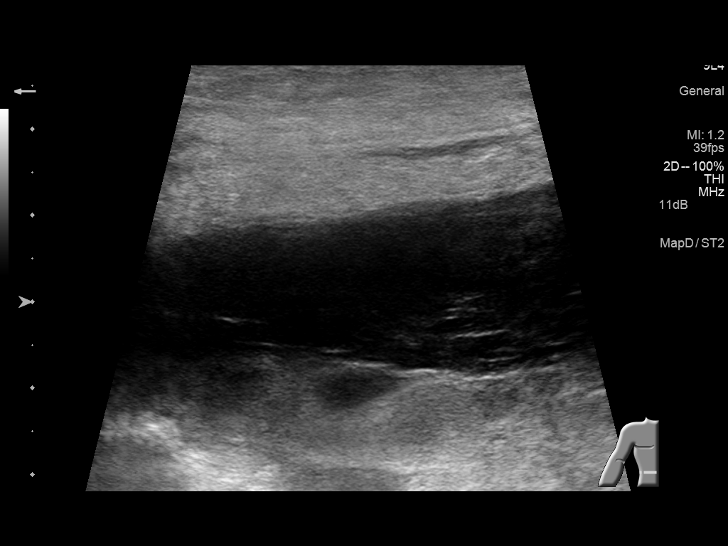

[13 of 16 positions shown; findings below may reference images not displayed]

Initial ultrasound scanning demonstrates a large complex mixed
anechoic and echogenic fluid collection within the anterior aspect
of the right lateral abdominal wall. The cranial aspect of the
complex fluid collection was noted to be anechoic and as such, this
section was targeted for fluid aspiration.

The skin overlying the right upper abdomen was prepped and draped in
the usual sterile fashion. 1% lidocaine with epinephrine was used
for local anesthesia. Under direct ultrasound guidance, a 19 gauge,
7-cm, Yueh catheter sheath needle was introduced into an anechoic
portion of the fluid collection. An ultrasound image was saved for
documentation purposes. As only approximately 3 cc of viscous bloody
fluid was able to be aspirated, approximately 5 cc of saline was
injected and subsequent aspirated.

A separate component of the anechoic portion of the mixed echogenic
fluid collection was then sampled with a 19 7-cm, Yueh catheter
sheath needle. An ultrasound image was saved for documentation
purposed. Again, only approximately 3 cc of viscous, bloody fluid
was able to be aspirated.

The catheter was removed and a dressing was applied. The patient
tolerated the procedure well without immediate post procedural
complication.
FINDINGS: Despite sampling the anechoic section of the mixed echogenic fluid
collection within the right lateral anterior abdominal wall, only a
total of approximately 6 cc of viscous bloody fluid was able to be
aspirated. All aspirated fluid was sent to the laboratory for
analysis.
IMPRESSION: Technically successful ultrasound-guided aspiration of approximately
6 cc of dark red viscous fluid from the anechoic component of the
mixed echogenic right anterior abdominal wall fluid collection.
Procedural and imaging findings compatible with an evolving
hematoma.

## 2015-04-07 NOTE — H&P (Signed)
PATIENT NAME:  Joshua Ibarra, Joshua Ibarra, Joshua Ibarra  DATE OF ADMISSION:  03/15/2013  PRIMARY CARE PHYSICIAN:  Barry BrunnerGlenn Willett, M.D.   CHIEF COMPLAINT: Shortness of breath and weakness.   HISTORY OF PRESENT ILLNESS: A 81 year old male patient with history of atrial fibrillation, diastolic congestive heart failure, chronic obstructive pulmonary disease on home oxygen, presents to the Emergency Room complaining of shortness of breath and worsening weakness of 3 days. The patient also had a fall on Friday feeling extremely weak, did not lose consciousness, did not hit his head. He has had a dry cough. Complains of some pain in the right lower chest which is pleuritic on coughing. Nonproductive cough, no PND or orthopnea, has had chronic edema in the lower extremities, which has not worsened. No nausea, vomiting, diarrhea, abdominal pain.   Chest x-ray shows possible paraspinal infiltrate. No pulmonary edema.   The patient tried to ambulate in the Emergency Room and could not get out of bed. He is being admitted to the hospitalist service for pneumonia, weakness. The patient may need skilled nursing facility at the time of discharge.   PAST MEDICAL HISTORY: 1.  History of paroxysmal atrial fibrillation on Coumadin.  2.  Diastolic congestive heart failure.  3.  Chronic obstructive pulmonary disease.  4.  Chronic respiratory failure, on home oxygen.  5.  Alcohol abuse.   PAST SURGICAL HISTORY: Bilateral knee replacements and sinus surgery.   ALLERGIES: No known drug allergies.   SOCIAL HISTORY: The patient smokes 1 to 2 cigars occasionally but not every day. Used to work as a Forensic scientistchemical engineer in the past.   CODE STATUS: Full code.   FAMILY HISTORY: Father died at age 79 of lung cancer. Mother died at 6193 of natural causes.   HOME MEDICATIONS: Include:   1.  Allopurinol 300 mg oral once a day.  2.  Coreg 12.5 mg oral 2 times a day.  3.  Lasix 40 mg oral once a day.  4.   Gabapentin 100 mg oral 2 times a day.  5.  Potassium gluconate 2 tablets oral once a day.  6.  Coumadin 4 mg oral once a day.   REVIEW OF SYSTEMS: CONSTITUTIONAL: Complains of fatigue and weakness. No fever, weight loss, weight gain.  EYES: No blurred vision, pain or redness.  ENT: No tinnitus, ear pain, hearing loss.  CARDIOVASCULAR: No chest pain or orthopnea. He has chronic edema.  RESPIRATORY: Has increasing shortness of breath and dry cough, some pleuritic chest pain.  GENITOURINARY: No dysuria, frequency, incontinence or hematuria.  MUSCULOSKELETAL: Has arthritis, no back pain.  SKIN: No petechiae, rash, ulcers.  HEMATOLOGIC/LYMPHATIC: No anemia, easy bruising or bleeding.  PSYCHIATRIC: No anxiety, depression.  ENDOCRINE: No polyuria, nocturia, thyroid problems.   PHYSICAL EXAMINATION: VITAL SIGNS: Temperature 97.7, blood pressure 121/79 and 99/58, saturating 95% on 2 liters oxygen.  GENERAL: Morbidly obese Caucasian male patient lying in bed in mild respiratory distress.  PSYCHIATRIC: Alert, oriented x 3. Mood and affect appropriate. Judgment intact.  HEENT: Atraumatic, normocephalic. Oral mucosa moist and pink. External ears and nose normal. No pallor. No icterus. Pupils bilaterally equal and reactive to light.  NECK: Supple. No thyromegaly. No palpable lymph nodes. Trachea midline. No carotid bruit, JVD.  CARDIOVASCULAR: S1 and S2 with a murmur and 3+ edema in the lower extremities pitting.  RESPIRATORY: Has crackles bilaterally at the bases. Good air entry.  GASTROINTESTINAL: No nausea, vomiting, diarrhea, abdominal pain.  GENITOURINARY: No CVA tenderness  or bladder distention.   SKIN: Warm and dry. No petechiae, rash or ulcers. Has erythema in the lower extremities anteror part of legs which is chronic, as per the patient. No ulcers.  MUSCULOSKELETAL: No joint swelling, redness, effusion of the large joints. Normal muscle tone.  NEUROLOGICAL: Motor strength 4+/5 in lower  extremities symmetrical, sensation is intact all over. Cranial nerves II through XII intact. Gait not tested.  LYMPHATIC: No cervical lymphadenopathy.   LABORATORY, DIAGNOSTIC AND RADIOLOGIC DATA:  Glucose 90, BUN 19, creatinine 1.31, sodium 137, potassium 3.5. GFR 51. CK of 93, troponin less than 0.02. WBC 7.5, hemoglobin 13, platelets 148, MCV 104.   EKG shows atrial fibrillation with rate of 90.   Chest x-ray shows paraspinal region with increased density, possible infiltrate.   ASSESSMENT AND PLAN: 1.  Possible paraspinal infiltrate. We will start the patient on antibiotics, get blood sputum cultures and oxygen support. We will also get a CT scan of the chest to see if this is a mass  and not an infiltrate with the patient's smoking history.  2.  Chronic respiratory failure and mild worsening secondary to the pneumonia.  3.  Weakness likely secondary from the pneumonia. We will have physical therapy see the patient. He might need skilled nursing facility at the time of discharge.  4.  Atrial fibrillation, rate controlled. Continue the Coreg and Coumadin the patient is on. We will check an INR.  5.  Diastolic congestive heart failure stable. Continue beta blocker and Lasix.  6.  Deep vein thrombosis prophylaxis on Coumadin.   TIME SPENT: Today on this case was 40 minutes    ____________________________ Molinda Bailiff. Cyan Clippinger, MD srs:cc D: 03/15/2013 16:14:57 ET T: 03/15/2013 17:26:54 ET JOB#: [id number]  cc: Wardell Heath R. Siriah Treat, MD, <Dictator> Orie Fisherman MD ELECTRONICALLY SIGNED 03/22/2013 15:00

## 2015-04-07 NOTE — Discharge Summary (Signed)
PATIENT NAME:  Joshua MeekBAKER, Mykel R MR#:  [id number] DATE OF BIRTH:  12/03/1931  DATE OF ADMISSION:  03/15/2013 DATE OF DISCHARGE:  03/19/2013  PRIMARY CARE PHYSICIAN:  Dr. Barry BrunnerGlenn Willett.  ADMISSION DIAGNOSIS:  Shortness of breath and weakness.   DISCHARGE DIAGNOSES: 1.  Acute on chronic respiratory failure.  2.  Community-acquired pneumonia.  3.  Generalized weakness with gait dysfunction.  4.  Anemia of chronic disease.  5.  Thrombocytopenia.  6.  Atrial fibrillation.  7.  Supratherapeutic INR.  8.  Diastolic congestive heart failure, compensated.  9.  Hypokalemia. The patient is on potassium now.  10.  Acute kidney injury with a creatinine of 1.31, at discharge 1.02.  11.  Hypomagnesemia.  12.  Microcytosis.   MEDICATIONS AT DISCHARGE:  1.  Allopurinol 300 mg once a day.  2.  Gabapentin 100 mg twice daily.  3.  Coreg 12.5 mg twice daily.  4.  Warfarin 4 mg once a day on Tuesday, Wednesday, Thursday, Saturday and Sunday, and 5 mg on Monday and Friday.  5.  Furosemide 40 mg once a day.  6.  Potassium gluconate 2 tablets once a day.  7.  Naproxen 250 mg every 8 hours as needed for pain.  8.  Aspirin 81 mg daily.  9.  Albuterol 25/0.5 inhaler 3 mL 4 times a day.  10.  Nystatin 100 mg 3 times daily.  11.  Guaifenesin 600 mg every 12 hours.  12.  Protonix 40 mg once a day.  13.  Amoxicillin with clavulanate/Augmentin XR 1000 mg/62.5 mg 2 times a day for the next 10 days.   DISPOSITION:  Skilled nursing facility.   IMPORTANT RESULTS:  INR on admission 5.8, at discharge 2.2.  Hemoglobin on admission 13, at discharge 10.9; white count is 4.7, platelet count 148, at discharge 135; stable MCV, MCH slightly elevated. Blood cultures were negative. Sputum cultures unobtainable. Cardiac enzymes negative. Creatinine on admission 1.31, discharge 1.32. BNP 4200. Magnesium is 3.9.   HOSPITAL COURSE:  The patient is a very nice 81 year old gentleman who has history of multiple medical problems  including diastolic dysfunction, chronic respiratory failure, COPD, atrial fibrillation, who comes to the Emergency Room on 03/15/2013, and he was admitted with  concern of having a fall on Friday, feeling extremely weak, without loss of consciousness, but weakness continued, and the patient started developing some dry cough, right lower chest pain, which is pleuritic. Cough was mostly nonproductive. He does have chronic edema of the lower extremities that has not changed much.   Chest x-ray showed a possible  infiltrate. No pulmonary edema. The patient was admitted for treatment of pneumonia.   During this hospitalization the patient remained stable. First and second  day he was really tired, lethargic with increased shortness of breath and cough. Those symptoms started to improve.  The patient is on 2 liters nasal cannula and his oxygen saturations are around 98%. He was afebrile and remained afebrile during the whole hospitalization.   1.  As far as his pneumonia, blood cultures were taken, and sputum culture was ordered, but the patient was not able to collect any sputum as everything was dry. He was started on Mucinex and that actually helpful a little bit to cough up some of the sputum. He had an x-ray showing an infiltrate, and will consider a CT scan to do in the future if the infiltrate does not clear up, but so far we are treating this as pneumonia. He was on Rocephin  and azithromycin, now he is going to be discharged on Augmentin XR.  He already had 5 days of azithromycin for what I think is  appropriate to stop.  Continue pulmonary toilet. Continue nebs.   2.  Weakness, likely due to exacerbation of his lung problems. The patient is very tired. He will benefit from skilled nursing facility. The patient with ambulation problems and high risk of falling, and the patient is on Coumadin.  3.  Anemia of chronic disease is stable. Hemoglobin was 13, but that was due to possible dehydration. IV  fluids were given, and the patient improved.  4.  Atrial fibrillation, well controlled. The patient to continue on Coreg and Coumadin.  5.  Supratherapeutic INR.  The patient came with an INR of 5.8. His doses were held, and now we are going back to his normal doses of Coumadin. The patient needs to get this monitored very frequently. As mentioned above, the patient has some falls.  I think at this moment I am not going to take him off the Coumadin, but consider doing that if the patient continues to fall. The reason for sending him to a skilled nursing facility is to improve his level of ambulation so that way he can stay on his warfarin.  6.  Diastolic dysfunction/heart failure. The patient did not have any decompensation here. He is well compensated. Chest x-ray had some increase in alveolar edema, although did not look like pulmonary edema.  7.  The patient on beta blocker, on Lasix.  8.  Thrombocytopenia, likely consumptive.  9.  Other medical problems were stable. I spoke with the wife prior to discharging the patient.  She was concerned mostly for his ambulation, and she is happy that he is going to a skilled nursing facility.   I spent about 35 minutes total on this discharge.   ____________________________ Felipa Furnace, MD rsg:dmm D: 03/19/2013 11:41:00 ET T: 03/19/2013 11:56:31 ET JOB#: [id number]  cc: Felipa Furnace, MD, <Dictator> Jorje Guild. Beckey Downing, MD Regan Rakers Juanda Chance MD ELECTRONICALLY SIGNED 03/25/2013 22:36

## 2015-04-08 NOTE — Discharge Summary (Signed)
PATIENT NAME:  Joshua Ibarra, Joshua Ibarra MR#:  [id number] DATE OF BIRTH:  Apr 03, 1931  DATE OF ADMISSION:  09/20/2014 DATE OF DISCHARGE:  09/27/2014  TRANSFER SUMMARY   DIAGNOSES: 1.  Postoperative wound infection.  2.  Obesity.  3.  Alcohol abuse history.  4.   Peripheral neuropathy.  5.  Atrial fibrillation with anticoagulation.  6.  Hypertension.  7.  Sleep apnea.  8.  Gout.  9.  Congestive heart failure.   PROCEDURES: Wound VAC placement.   HISTORY OF PRESENT ILLNESS AND HOSPITAL COURSE: This is a patient who had an open cholecystectomy for complicated acute cholecystitis with pancreatitis. His staples had been removed and his wound broke open showing an obvious infection with fevers. He was admitted to the hospital, where wound VAC placement was performed and he was placed on IV Zosyn, which was later changed to Levaquin which he had been on as an outpatient as well. He is currently tolerating a regular diet. He is back on his Coumadin with INRs over 2.2. His medications are unchanged. He will be given a prescription for a controlled substance in the form of Vicodin 300 mg q. 6 hours p.Ibarra.n. pain. He will also be restarted on his Levaquin. Other medications are seen in the medical reconciliation sheet.   The patient can shower at this time. He will require wound VAC change every 3 to 4 days and can be seen in our office in 10 days for re-evaluation. Currently, he is tolerating a regular diet. His wound infection is cleared and his wound is granulating well.    ____________________________ Adah Salvageichard E. Excell Seltzerooper, MD rec:at D: 09/27/2014 13:06:16 ET T: 09/27/2014 13:32:33 ET JOB#: [id number]  cc: Adah Salvageichard E. Excell Seltzerooper, MD, <Dictator> Lattie HawICHARD E COOPER MD ELECTRONICALLY SIGNED 09/27/2014 15:21

## 2015-04-08 NOTE — H&P (Signed)
   Subjective/Chief Complaint Purulent drainage from cholecystectomy wound   History of Present Illness Joshua Ibarra is Ibarra pleasant 81 yo M who underwent difficult open cholecystectomy for gallstone pancreatitis and cholecystitis on 9/25 who presents with persistent significant purulent drainage from wound.  Was discharged on 10/1 with known infection and removal of staples.  Has persisted and was noted to have fever at nursing home.  Otherwise only complaint was pain from abdominal wound.  Tolerating diet.  Having regular BM.  On PO levaquin.   Past History Cholecystectomy, open Pancreatitis Hematuria H/o Acute CHF ? pneumonia Obesity H/o Etoh abuse H/o peripheral neuropathy H/o abnormal LFT HTN Sleep apnea Gout H/o sinus surgery H/o bilateral knee surgery   Past Medical Health Hypertension, Alcohol Consumption/Dependency   Code Status Full Code   Past Med/Surgical Hx:  COPD:   Thrombocytopenia:   Multi-drug Resistant Organism (MDRO): Positive culture for MRSA.  Obesity:   Alcohol Abuse:   Peripheral Neuropathy:   liver function abnormal:   HTN:   Sleep Apnea mild no cpap needed:   Gout:   Atrial Fibrillation:   CHF:   closure of vein in leg  both:   sinus surgery:   Knee Surgery - Left & Right replacement:   bilateral knee replacements:   ALLERGIES:  No Known Allergies:   Family and Social History:  Family History Non-Contributory    Social History negative tobacco, negative ETOH   Place of Living Nursing Home    Review of Systems:  Subjective/Chief Complaint Purulent drainage from incisional wound   Fever/Chills Yes    Cough No    Sputum No    Abdominal Pain Yes    Diarrhea No    Constipation No    Nausea/Vomiting No    SOB/DOE No    Chest Pain No    Dysuria No    Tolerating Diet Yes    Physical Exam:  GEN well developed, well nourished, no acute distress, obese, disheveled   HEENT pink conjunctivae, PERRL, moist oral mucosa   RESP  normal resp effort  clear BS    CARD regular rate  no murmur    ABD positive tenderness  no hernia  soft  rigid  normal BS    LYMPH negative neck, negative axillae   SKIN normal to palpation, No rashes, No ulcers   NEURO cranial nerves intact, negative tremor, follows commands   PSYCH alert, Ibarra+O to time, place, person, good insight    Assessment/Admission Diagnosis 81 yo M with postop wound infection.  Multiple staples removed with copious puruleence.  Fascia intact.  Packed with dry gauze.   Plan Will discuss possible admission for wound infection/IV abx.  Will continue tid dry to dry dressing changes until purulence improves, then wet to dry.  May benefit from wound vac when wound is clean.   Electronic Signatures: Joshua Ibarra, Joshua Ibarra (MD)  (Signed 06-Oct-15 11:59)  Authored: CHIEF COMPLAINT and HISTORY, PAST MEDICAL/SURGIAL HISTORY, ALLERGIES, HOME MEDICATIONS, FAMILY AND SOCIAL HISTORY, REVIEW OF SYSTEMS, PHYSICAL EXAM, ASSESSMENT AND PLAN   Last Updated: 06-Oct-15 11:59 by Joshua Ibarra, Joshua Ibarra (MD)

## 2015-04-08 NOTE — H&P (Signed)
PATIENT NAME:  Joshua Ibarra, Joshua Ibarra MR#:  [id number] DATE OF BIRTH:  [birth year]  DATE OF ADMISSION:  09/06/2014  PRIMARY CARE PHYSICIAN: Haynes Kerns, MD   CHIEF COMPLAINT: Abdominal pain and blood in urine.   HISTORY OF PRESENT ILLNESS: Joshua Ibarra is an 81 year old Caucasian gentleman with past medical history of chronic atrial fibrillation, on Coumadin, hypertension, peripheral neuropathy, who comes to the Emergency Room with complaints of abdominal pain, diarrhea and passing some blood in urine. The patient seems to be a poor historian. He is not able to explain any of his symptoms completely. He had some diarrhea, which he tells "I've gotten over it."  Complains of some right upper quadrant abdominal pain and some nausea and vomiting 2 days ago. He has also had a couple falls at home, feeling weak, and fatigued. The patient said he has been having some blood in urine; however, his UA is negative for any blood.  He does have some pyuria and possible UTI.  The patient was also noted to have elevated lipase and liver enzymes were elevated.  He drinks about two 4 ounces of bourbon whiskey twice a day for a long time. Denies any alcohol-related DTs or alcohol withdrawal seizures. The patient was found to be mildly dehydrated. He is being admitted for further evaluation and management.   PAST MEDICAL HISTORY: 1.  Obesity.  2.  Alcohol abuse.  3.  Peripheral neuropathy.  4.  Chronic atrial fibrillation, on Coumadin.  5.  History of abnormal liver function tests.  6.  Hypertension.  7.  Sleep apnea, mild, but does not use CPAP.  8.  Gout.  9.  History of CHF.   10.  Sinus surgery.  11.  Bilateral knee surgery with knee replacement.  ALLERGIES: No known drug allergies.   MEDICATIONS: 1.  Warfarin 3 mg 1-1/2 tablets every Monday and Friday and 3 mg on rest of the days which is  Tuesday, Wednesday, Thursday, Saturday and Sunday.  2.  Gabapentin 100 mg b.i.d.  3.  Lasix 40 mg daily.  4.   Cyanocobalamin 1000 mcg p.o. daily.  5.  Carvedilol 3.125 b.i.d.  6.  Allopurinol 300 mg p.o. daily.   SOCIAL HISTORY: Lives at home with wife. Drinks about two 4 ounces of bourbon twice a day.  Denies any alcohol-related complications.  Smokes 2 cigars every day.   FAMILY HISTORY: Positive for lung cancer.   REVIEW OF SYSTEMS:  CONSTITUTIONAL: No fever. Positive for fatigue, weakness.  EYES: No blurred or double vision, glaucoma, or cataract.  EARS, NOSE, THROAT: No tinnitus, ear pain, hearing loss or snoring.  RESPIRATORY: No cough, wheeze, or dyspnea. Positive for COPD. CARDIOVASCULAR: No chest pain, orthopnea, edema, or arrhythmia.  GASTROINTESTINAL: No nausea, vomiting, diarrhea, abdominal pain. Positive for diarrhea earlier.  No GERD. GENITOURINARY: The patient reports hematuria and dysuria.  ENDOCRINE: No polyuria, nocturia, or thyroid problems.  HEMATOLOGY: No anemia, easy bruising, or bleeding.  SKIN: No acne, rash, or lesion.  MUSCULOSKELETAL: Positive for arthritis. No swelling or gout.  NEUROLOGIC: No CVA, TIA, or dementia.  PSYCHIATRIC: No anxiety or depression. All other systems reviewed and negative.   PHYSICAL EXAMINATION: GENERAL: The patient is morbidly obese, not in acute distress.  VITAL SIGNS: Afebrile. Pulse is 72,  irregularly irregular. Blood pressure is 111/68. Saturations are 96% on room air.  HEENT: Atraumatic, normocephalic. PERRLA, EOM intact. Oral mucosa is moist.  NECK: Supple. No JVD. No carotid bruit.  RESPIRATORY: Clear to auscultation bilaterally. No  rales, rhonchi, respiratory distress, or labored breathing.  CARDIOVASCULAR:  Both the heart sounds are normal, irregularly irregular rhythm. No murmur heard. PMI not lateralized.  ABDOMEN: Obese, soft, cannot elicit any Murphy's sign or any tenderness. No organomegaly. Bowel sounds could not be appreciated due to obesity.  EXTREMITIES: Pitting edema present, bilateral lower extremity feeble pedal pulses,  good femoral pulses.  SKIN: Warm and dry.  PSYCHIATRIC: The patient is awake, alert, oriented x 3. Mood and affect normal.  NEUROLOGIC:  Grossly cranial nerves appear intact. Neurologic exam appears nonfocal.   IMAGING: Ultrasound of the abdomen shows contracted gallbladder with multiple gall stones, largest is 9 mm. Hepatic increased echogenicity suggests steatosis without any intrahepatic ductal dilatation.  LABORATORY DATA:  UA positive for mild UTI. Negative for any blood.  Troponin is less than 0.02. White count is 7.1, H and H is 12.3 and 38.0, platelet count is 138,000. Glucose is 98, BUN is 21, creatinine is 1.38. Bilirubin is 1.9, alkaline phosphatase is 194. SGPT 160, SGOT is 83, albumin is 2.7. Lipase is 2011.  PT/INR is 19.2 and 1.7.   EKG shows atrial fibrillation.   ASSESSMENT:  An 81 year old Joshua Ibarra with multiple medical problems including chronic atrial fibrillation, on Coumadin, history of hypertension, and CHF, and history of peripheral neuropathy, comes in with: 1.  Acute pancreatitis with elevated lipase and abdominal pain, and nausea, vomiting 2 days ago at home. Etiology appears either gallstone induced versus alcohol abuse. The patient, however, continued alcohol as two 4 ounce bourbon whiskey shots he takes twice a day.  Denies any alcohol-related problems that he is aware of.  We will keep the patient n.p.o. except ice chips and sips of water. Will place surgical consultation for evaluation of gallstones. Follow up lipid profile, lipase and liver function tests. Will continue intravenous fluids for hydration. 2.  Diarrhea, resolved.  3.  Acute renal failure secondary to gastrointestinal losses. Will continue intravenous fluids,  avoid nephrotoxins, monitor ins and outs and monitor creatinine.  4.  Chronic atrial fibrillation, on Coumadin. I will continue the patient's carvedilol and Coumadin. His urinalysis at present does not show any blood cells. Therefore, I will  continue on warfarin. Monitor hemoglobin and hematocrit and urinalysis for any frank hematuria.  5.  Possible hematuria as per patient's complaint.  The patient's urinalysis is negative for any blood. I will give him a course of antibiotic for 5 days to ensure if he has any infection-related hematuria if it clears up.  If the patient continues to have symptoms, consider urology consultation either inpatient or outpatient.  6.  Peripheral neuropathy, continue gabapentin.  7.  Alcohol abuse. Will keep patient on p.r.n. lorazepam. The patient's vitals are stable. Does not appear to have withdrawal symptoms at present.  8.  Deep vein thrombosis prophylaxis. The patient is already on Coumadin.   The above was discussed with the patient, who is agreeable to it.   TIME SPENT: Fifty minutes.   ____________________________ Wylie HailSona A. Allena KatzPatel, MD sap:LT D: 09/06/2014 19:42:33 ET T: 09/06/2014 20:59:59 ET JOB#: [id number]  cc: Shareka Casale A. Allena KatzPatel, MD, <Dictator> Haynes Kernsharanjit S. Virk, MD   Willow OraSONA A Noelle Sease MD ELECTRONICALLY SIGNED 09/08/2014 15:24

## 2015-04-08 NOTE — Op Note (Signed)
PATIENT NAME:  Joshua Ibarra, Joshua Ibarra MR#:  [id number] DATE OF BIRTH:  [birth year]  DATE OF PROCEDURE:  09/09/2014  PREOPERATIVE DIAGNOSIS: Cholelithiasis and gallstone-related pancreatitis.   POSTOPERATIVE DIAGNOSES:   1. Cholelithiasis and gallstone-related pancreatitis. 2. Severe chronic cholecystitis., ? granulomatous cholecystitis.  PROCEDURE PERFORMED:  1. Attempted robotically-assisted laparoscopic cholecystectomy.  2. Open cholecystectomy and Graham patch repair of duodenal serosal injury.  Surgeron Dr Natale LayMark Yisroel Mullendore, Joshua Ibarra   Assistant: Dr Hulda Marinimothy Oaks, Joshua Ibarra   DESCRIPTION OF PROCEDURE: With informed consent, supine position, sterile prep and drape of the abdomen, a 12 mm blunt Hassan trocar was placed through an open technique through a vertically oriented skin incision in the umbilicus. Pneumoperitoneum was established. The patient was then positioned in reverse Trendelenburg and airplaned right side up. Remaining trocars were placed for a multiport robotically-assisted laparoscopic cholecystectomy. The patient cart was docked. Dissection was then undertaken initially along the liver edge, as the omentum was densely adherent to what appeared to be the gallbladder and right hepatic lobe. After approximately 30 minutes of dissection laparoscopically, I could not obtain clear access to the gallbladder and as such, an open procedure was then converted to. Scar was not amenable to a laparoscopic approach.  The patient cart was undocked. Ports were then removed and a right upper quadrant Kocher incision was fashioned through musculofascial layers utilizing electrocautery. A self-retaining abdominal retractor was placed. Two laps were then placed behind the right lobe of the liver. The remaining omental adhesions were taken down off the gallbladder wall and body utilizing electrocautery. The gallbladder was markedly contracted and involved in a dense inflammatory response, which appeared to be very chronic with  minimal acute changes in the right upper quadrant and the hepatoduodenal ligament was markedly contracted down. The cystic artery and cystic duct were critically identified, very diminutive in size and occluded with 5 mm clip appliers without difficulty. Due to the size of the cystic duct, the cholangiogram was deferred.   In the process of doing this, the duodenum was markedly adherent to the gallbladder and a small serosal tear was identified. It appeared to be in the first portion of the duodenum. This was oversewed with Lembert-type 3-0 silk sutures. With the gallbladder being released off the gallbladder fossa, it was submitted to pathology in formalin. Buttressing of the repair of the duodenum was performed utilizing a tongue of omentum as a Psychologist, prison and probation servicesGraham patch. With lap and needle count correct x 2, a 19 mm Blake drain was directed into the HornbeckMorison pouch and exited the lowermost right upper quadrant trocar site. The infraumbilical fascial defect was closed in a figure-of-eight #0 Vicryl suture. Abdominal fascia was closed with running #1 PDS from the extremes of the wound in a 2 layered closure. Skin edges were reapproximated utilizing a skin stapler and the drain site was secured with two 3-0 silk sutures.   The patient was subsequently extubated and taken to the recovery room in stable and satisfactory condition by anesthesia services.     ____________________________ Redge GainerMark A. Egbert GaribaldiBird, Joshua Ibarra mab:TT D: 09/10/2014 12:08:00 ET T: 09/10/2014 16:59:07 ET JOB#: [id number]  cc: Loraine LericheMark A. Egbert GaribaldiBird, Joshua Ibarra, <Dictator> Joshua Ibarra ELECTRONICALLY SIGNED 09/11/2014 11:57

## 2015-04-08 NOTE — Consult Note (Signed)
PATIENT NAME:  Joshua Ibarra, Joshua Ibarra MR#:  [id number] DATE OF BIRTH:  Mar 05, 1931  DATE OF CONSULTATION:  10/03/2014  REFERRING PHYSICIAN:   CONSULTING PHYSICIAN:  Crysta Gulick A. Carliss Quast, MD  REASON FOR CONSULTATION: Hypertension, confusion, shortness of breath.   HISTORY OF PRESENT ILLNESS: Joshua Ibarra is a pleasant 81 year old male who was recently discharged from our service following admission for postoperative wound infection for which he underwent wet to dry and had Wound VAC placement after a previous open cholecystectomy. He was noted per ED doctor and nursing reports to be mentating normal and functional yesterday and then today became more confused and short of breath and upon arrival he was noted to be tachycardic and hypotensive. When asked if he has abdominal pain he does respond that he does have abdominal pain, but it is relatively unremarkable. Does have a leukocytosis of 25,000 and a creatinine of 2.71, however is not horribly acidotic according to his bicarbonate despite having renal issues.  He has not had a reported fever since here.    REVIEW OF SYSTEMS: Unable to be obtained.   PAST MEDICAL HISTORY:   1. Wound infection status post open cholecystectomy.  2. History of pancreatitis.  3. History of hematuria.  4. History of acute CHF.   5. Questionable history of pneumonia.  6. History of obesity.  7. History of alcohol abuse.  8. History of peripheral neuropathy.  9. History of abnormal LFTs.  10. Hypertension.  11. Sleep apnea.  12. Gout.  13. History of bilateral knee surgery.   14. History of sinus surgery.   FAMILY HISTORY: Noncontributory.   SOCIAL HISTORY:  Negative tobacco,  negative alcohol. Currently lives in a nursing home.   PHYSICAL EXAMINATION:  VITAL SIGNS: Currently a temperature of 97.5, pulse 110, blood pressure 62/38, respirations 26, 100% on room air.  GENERAL: No acute distress, but is confused.  HEAD: Normocephalic, atraumatic.  EYES: No scleral  icterus. No conjunctivitis.  FACE: No obvious facial trauma. Normal external nose. Normal external ears.  CHEST: Lungs clear to auscultation, moving air well.  HEART: Tachycardic, regular rate and rhythm.  ABDOMEN: Soft, nontender. Wound is clean, dry, intact with good granulation tissue.  EXTREMITIES: Moves all extremities well. Symmetrical in strength.  NEUROLOGIC: Cranial nerves II-XII grossly intact. Sensation intact in all 4 extremities.   OUTPATIENT MEDICATIONS:    1. Warfarin.  2. Levaquin which he was discharged on.   3. Neurontin.  4. Lasix.  5. Cyanocobalamine.   6. Carvedilol.  7. Ambien.  8. Allopurinol.  9. Norco.   IMAGING: Chest x-ray shows no obvious findings.   LABORATORY DATA: Significant for white cell count of 24.7, hemoglobin 11.7, hematocrit 36.3, platelets are 348,000. Lactate is 7. INR is 2.2. Bicarbonate is 23, BUN 34, creatinine is 2.71, baseline creatinine is 1.28 at approximate date of admission and is normal at date of admission.   ASSESSMENT AND PLAN:  Joshua Ibarra is a pleasant 81 year old who presents with findings consistent with sepsis. Etiology is not quite certain, cannot rule out abdominal cause. Would recommend CT scan of abdomen to insure no intra-abdominal process including abscess, wound infection, hepatic abscess due to recent cholecystectomy, infected pseudocyst due to previous pancreatitis, or other intra-abdominal process. Would recommend supportive care including IV fluid and pressors and antibiotics. In the meantime needs significant resuscitation in my opinion. We will continue to follow.    ____________________________ Joshua Raider Keagan Anthis, MD cal:bu D: 10/03/2014 17:59:30 ET T: 10/03/2014 18:57:56 ET JOB#: [id number]  cc:  Joshua Slowey A. Sheridyn Canino, MD, <Dictator> Joshua NewcomerHRISTOPHER A Deondray Ospina MD ELECTRONICALLY SIGNED 10/18/2014 7:02

## 2015-04-08 NOTE — Discharge Summary (Signed)
PATIENT NAME:  Joshua Ibarra, Joshua Ibarra MR#:  [id number] DATE OF BIRTH:  [birth year]  DATE OF ADMISSION:  10/03/2014 DATE OF DISCHARGE:  10/07/2014  DISPOSITION:  Discharged to hospice on October 23.   PRESENTING COMPLAINT: Lethargy and abdominal pain.   DISCHARGE DIAGNOSES:  1.  Septic shock with Enterococcus faecalis.  2.  Large hematoma in the right rectus muscle sheath.  3.  Acute renal failure due to acute tubular necrosis from septic shock.  4.  Atrial fibrillation.  5.  Lactic acidosis.  6.  Acute blood loss anemia, likely bleeding into rectus sheath.  7.  History of hypertension, however, with relative hypotension.   CONSULTATION: Efraim Kaufmann, MD, palliative care. Christopher A. Lundquist, MD, surgery.   Dr. Candiss Norse, nephrology.   BRIEF SUMMARY OF HOSPITAL COURSE: Karder Goodin is an 81 year old Caucasian gentleman who was recently admitted for gallbladder removal, had to undergo removal. The patient had been dealing with wound infection, went to rehab came back with:  1.  Septic shock, which was due to Enterococcus faecalis, 1 out of 2 blood cultures positive from admission.  Came in with white count, very elevated white count, and lactic acid of 7.0.  White count is down to 8.6, started on IV vancomycin and Zosyn, which was changed to IV Unasyn once blood cultures were positive for Enterococcus faecalis. CT of the abdomen was done which suggested a large possibly infected hematoma about 21 cm. The patient underwent interventional radiology drain which fluid was removed which remains negative for any infection. He is not a candidate for surgery given his low blood pressure and several other comorbidities. He remained on IV vasopressin and Levophed, which were weaned off slowly. Family met with Dr. Ermalinda Memos given his multiple comorbidities and recent hospitalization, recurrent for several other issues. The patient's family opted for hospice home for which he is going to be discharged today.  2.   Large hematoma right rectus muscle sheath. The patient was on Coumadin, dropped hemoglobin from 11.7 to 7.1, status post 2 unit blood transfusion. Intervention radiology drained the hematoma, about 6 mL was removed. Fluid cultures were negative.  3.  Acute renal failure secondary to acute tubular necrosis from shock. Creatinine was 2.9 on admission, received IV fluids, came down to 1.95. Nephrology was following.  4.  Atrial fibrillation, rate controlled. Held Coumadin due to large hematoma.  5.  Acute blood loss anemia secondary to bleeding into rectus sheath, on Coumadin, transfused 2 units. Hemoglobin was 7.1.   CODE STATUS: No code, do not resuscitate. The patient was kept under comfort measures and is being now discharged to hospice home.   TIME SPENT: 40 minutes.     ____________________________ Hart Rochester Posey Pronto, MD sap:AT D: 10/07/2014 14:10:56 ET T: 10/08/2014 03:18:50 ET JOB#: [id number]  cc: Douglass Dunshee A. Posey Pronto, MD, <Dictator> Ilda Basset MD ELECTRONICALLY SIGNED 10/27/2014 7:46

## 2015-04-08 NOTE — Discharge Summary (Signed)
PATIENT NAME:  Joshua Ibarra, [name] MR#:  [id number] DATE OF BIRTH:  11-03-1931  DATE OF ADMISSION:  03/01/2014 DATE OF DISCHARGE:  03/04/2014  DISPOSITION: Discharged to skilled nursing facility.   DISCHARGE DIAGNOSES:  1. Generalized weakness, multifactorial, secondary to chronic obstructive pulmonary disease, congestive heart failure and alcohol.  2. Alcohol abuse.  3. Tobacco abuse.  4. B12 deficiency.  5. Chronic diastolic congestive heart failure.  6. Atrial fibrillation.  7. Chronic obstructive pulmonary disease.  8. Chronic thrombocytopenia.  9. Obesity.  10. Chronic kidney disease stage III.  11. Hypoalbuminemia.  12. Anemia of chronic disease.   IMAGING STUDIES DONE: Include:  A CT scan of the head without contrast which showed severe chronic involutional changes, nothing acute.  Chest x-ray, PA and lateral, showed stable cardiomegaly, no active disease.   ADMITTING HISTORY AND PHYSICAL: Please see detailed H and P dictated by Dr. Clint GuyHower. In brief, an 81 year old male patient brought into the Emergency Room complaining of shortness of breath, weakness, unable to get out of bed on his own, admitted for the weakness treatment. His shortness of breath was at baseline.   HOSPITAL COURSE: The patient was started on IV Lasix as he complained of worsening lower extremity edema. His breathing was at baseline. The patient has diuresed well. His edema seems a little better, although still persistent, for which his Lasix is increased from once a day to 2 times a day at discharge. His weakness is multifactorial secondary to alcohol abuse, COPD, congestive heart failure and morbid obesity, with worsening lower extremity edema. The patient was evaluated by physical therapy, and it has been recommended that he be discharged to a skilled nursing facility. The patient has waited for his insurance authorization in the hospital, which we have available right now, and the patient will be discharged to a  skilled nursing facility.   The patient had a brief episode of bleeding around the rectal area and has a skin tear in his gluteal cleft, and his bleeding had resolved on its own. The patient did have some elevated INR, which is down to normal. His warfarin is being decreased from 4 mg daily to 3 mg daily.   Prior to discharge, the patient's examination shows 3+ edema in the lower extremities. No crackles on lung examination. Abdomen is soft. The patient is alert and oriented x3.   DISCHARGE MEDICATIONS:  1. Coumadin 3 mg oral daily.  2. Allopurinol 300 mg oral daily.  3. Gabapentin 100 mg oral 2 times a day.  4. Lasix 40 mg 2 times a day.  5. Coreg 3.125 mg 2 times a day.  6. Cyanocobalamin 1000 mg oral once a day.  7. Magnesium oxide 400 mg oral once a day.  8. Advair Diskus 100/50 one puff b.i.d.    9. Spiriva 18 mcg daily.  10. Albuterol 2 puffs 4 times a day as needed for shortness of breath.   DISCHARGE INSTRUCTIONS: The patient has been counseled to quit smoking and alcohol. He will need a repeat PT/INR check on 03/07/2014. He will be on a low-sodium, low-fat diet. Activity as tolerated with a walker with assistance at all times.   TIME SPENT ON DAY OF DISCHARGE IN DISCHARGE ACTIVITY: 40 minutes.   ____________________________ Molinda BailiffSrikar R. Tawnia Schirm, MD srs:lb D: 03/04/2014 12:51:00 ET T: 03/04/2014 14:12:53 ET JOB#: [id number]  cc: Wardell HeathSrikar R. Arturo Freundlich, MD, <Dictator> Orie FishermanSRIKAR R Darlyne Schmiesing MD ELECTRONICALLY SIGNED 03/12/2014 10:27

## 2015-04-08 NOTE — H&P (Signed)
PATIENT NAME:  Joshua Ibarra, Joshua Ibarra MR#:  [id number] DATE OF BIRTH:  18-Apr-1931  PRIMARY CARE PHYSICIAN: Burley Saver, MD  CHIEF COMPLAINT:  Confusion, lethargy and shortness of breath today.   HISTORY OF PRESENT ILLNESS: An 81 year old Caucasian male with a history of recent abdominal surgery with abdominal wound infection, was sent from nursing home due to shortness of breath and confusion. The patient is lethargic, unable to provide any information. According to Dr. Carollee Massed and the patient and family member, the patient recently had cholecystectomy and had an abdominal wound infection, was treated with antibiotics and got wound VAC. He was discharged to a skilled nursing facility 6 days ago. The patient was fine until today. The patient was noticed to have confusion, lethargy, and shortness of breath and then was sent to ED for further evaluation. In ED, the patient's blood pressure was low at 60s. WBC 24. Lactic acid 7.0. Dr. Juliann Pulse placed central line just now.  The patient is being treated with IV normal saline bolus.   PAST MEDICAL HISTORY:  1.  Abdominal wound infection with MRSA.  2.  Alcohol abuse. 3.  Obesity. 4.  Peripheral neuropathy. 5.  History of atrial fibrillation, on Coumadin.  6.  Hypertension.  7.  Sleep apnea. 8.  Gout. 9.  History of CHF.   PAST SURGICAL HISTORY: Cholecystectomy, abdominal wound surgery, bilateral knee surgery with a knee replacement.   SOCIAL HISTORY: Nursing home resident recently. History of alcohol abuse and smoking.   FAMILY HISTORY: Lung cancer.  REVIEW OF SYSTEMS: Unable to obtain due to patient's mental status.   ALLERGIES: None.  HOME MEDICATIONS:  1.  Warfarin 3 mg p.o. daily except Monday and Friday; warfarin 3 mg, 1-1/2 tablets p.o. daily on Monday and Friday. 2.   Levaquin 500 mg p.o. daily.  3.  Gabapentin 100 mg p.o. b.i.d. 4.  Cyanocobalamin 1000 mcg p.o. daily. 5.  Coreg 3.125 mg p.o. b.i.d.  6.  Ambien 5 mg p.o. at  bedtime p.r.n. 7.  Allopurinol 300 mg p.o. daily. 8.  Percocet 325/5 mg p.o. tablets 1-2 tablets every 4 hours p.r.n.  9.  Lasix 40 mg p.o. daily.   PHYSICAL EXAMINATION: VITAL SIGNS: Temperature 97.5, blood pressure 72/56, pulse 102, O2 saturation 91% on oxygen. GENERAL: The patient is awake, lethargic, confused, critically ill looking.   HEENT: Pupils round, equal, reactive to light. No discharge from ear or nose.  Very dry oral mucosa.  Clear oropharynx.  NECK: Supple. No JVD or carotid bruit. No lymphadenopathy. No adenopathy. No thyromegaly.  CARDIOVASCULAR: S1, S2, regular rate with no murmurs or gallops.  PULMONARY: Bilateral air entry, mild expiratory wheezing. No crackles. No use of accessory muscle to breathe.  ABDOMEN: Obese, soft, diffuse tenderness. No rigidity. No rebound. Abdominal wound is clean. No discharge from wound. It is hard to estimate whether the patient has organomegaly. EXTREMITIES: No edema, clubbing, or cyanosis. No calf tenderness, but it is difficult to palpate if patient has bilateral pedal pulses due to hypotension.  SKIN: No rash or jaundice.  NEUROLOGIC: The patient is confused, lethargic, unable to examine at this time.   LABORATORY DATA: Lactic acid 7.0, WBC 24.7, hemoglobin 11.7, platelets 348,000, lipase 241, glucose 152, BUN 34, creatinine 2.71. Electrolytes are normal. Bilirubin 1.1, alkaline phosphatase 122, SGPT 20, SGOT 41, albumin 2.7. INR 2.2. Troponin less than 0.02.   IMPRESSIONS: 1.  Septic shock.  2.  Abdominal pain, abdominal wound infection.  3.  Acute metabolic encephalopathy due to  sepsis and renal failure.  4.  Acute renal failure.  5.  Likely acidosis.  6.  Coagulopathy.  7.  History of atrial fibrillation. 8.  Congestive heart failure. 9.  Hypertension.  PLAN OF TREATMENT: 1.  The patient will be admitted to critical care unit and continue normal saline bolus, started Levophed drip, started vancomycin, Zosyn.  2.  Follow up CBC,  a blood culture, chest x-ray, and urinalysis.  3.  We will get ID consult, so far sepsis etiology is not very clear.  According to Dr. Sharmon RevereLindquist, abdominal wound is clean. He suggested to start flagyle and get an abdominal CAT scan after the patient is stable.  4.  We will keep patient on NPO,  continue IV fluid support, follow up BMP and electrolytes.  5.  Since the patient has hypotension and renal failure, we will hold Coreg and the Lasix.  6.  We will hold Coumadin at this time, may resume coumadin tomorrow. 7.  We will get a palliative care consult.  8.  I discussed the patient's critical condition and plan of treatment with the patient's wife, and son. Discussed with Dr. Sharmon RevereLindquist and Dr. Carollee MassedKaminski.  The patient's code status is DO NOT RESUSCITATE.   TIME SPENT: About 76 minutes.    ____________________________ Shaune PollackQing Diontre Harps, MD qc:LT D: 10/03/2014 17:11:41 ET T: 10/03/2014 18:24:40 ET JOB#: [id number]  cc: Shaune PollackQing Montavius Subramaniam, MD, <Dictator> Shaune PollackQING Syrah Daughtrey MD ELECTRONICALLY SIGNED 10/03/2014 19:35

## 2015-04-08 NOTE — Consult Note (Signed)
Brief Consult Note: Diagnosis: biliary pancreatitis.   Patient was seen by consultant.   Consult note dictated.   Recommend further assessment or treatment.   Comments: follow LFT, INR and plts. Consider CT in 1-2 days if no rapid improvement in lipase. may need GI consult to consider preop ERCP if lft do not improve.  Electronic Signatures: Lattie Hawooper, Shamel Germond E (MD)  (Signed 22-Sep-15 22:25)  Authored: Brief Consult Note   Last Updated: 22-Sep-15 22:25 by Lattie Hawooper, Lindwood Mogel E (MD)

## 2015-04-08 NOTE — Op Note (Signed)
PATIENT NAME:  Joshua MeekBAKER, Evo R MR#:  [id number] DATE OF BIRTH:  04-27-1931  DATE OF PROCEDURE:  10/03/2014  PREOPERATIVE DIAGNOSIS: Sepsis, hypotension, need for intravenous access.   POSTOPERATIVE DIAGNOSIS: Sepsis, hypotension, need for intravenous access.   PROCEDURE PERFORMED: Ultrasound-guided left femoral vein central venous catheter placement.   SURGEON: Ida Roguehristopher Jovin Fester, MD  ANESTHESIA: None.  ESTIMATED BLOOD LOSS: 10 mL.   COMPLICATIONS: None.   SPECIMENS: None.   INDICATION FOR SURGERY: [name] is a pleasant 81 year old who presented with severe hypotension and confusion and poor IV access. I was thus consulted place a central line.   DETAILS OF PROCEDURE: Emergent consent was obtained per ER doc. ER doc Orvil FeilKaminsky had initially attempted right IJ and was not satisfied with ability to visualize the IJ and thus when I walked in the room he was attempting to put in a right groin central venous catheter. I ended up taking his dressings down and proceeded to perform a procedure on the left groin. I prepped and draped his groin in standard surgical fashion. A timeout was then performed correctly identifying the patient name, operative site and procedure to be performed. Ultrasound was used to isolate his easily palpable femoral vein. It did appear to be posterior to the femoral artery so I did have to go from a more medial aspect. I was able to access the vein with 2 sticks. Upon accessing the vein, I obtained nonpulsatile dark blood. I placed a wire through the access needle. It passed easily. The needle was withdrawn. A nick was made in the skin. The tract was dilated and it flushed. A triple-lumen catheter was placed over the wire. The wire was then withdrawn. All 3 ports flushed and drew easily. The catheter was then sutured in 5 places. There were no immediate complications. Needle, sponge, and instrument counts were correct at the end of the procedure.    ____________________________ Si Raiderhristopher A. Georgeanna Radziewicz, MD cal:sb D: 10/04/2014 12:33:22 ET T: 10/04/2014 13:04:30 ET JOB#: [id number]  cc: Cristal Deerhristopher A. Jose Alleyne, MD, <Dictator> Jarvis NewcomerHRISTOPHER A Anthoni Geerts MD ELECTRONICALLY SIGNED 10/18/2014 7:02

## 2015-04-08 NOTE — Consult Note (Signed)
PATIENT NAME:  KALE, RONDEAU MR#:  [id number] DATE OF BIRTH:  [birth year]  DATE OF CONSULTATION:  09/20/2014  REFERRING PHYSICIAN:   CONSULTING PHYSICIAN:  Hope Pigeon. Elisabeth Pigeon, MD  REFERRING PHYSICIAN: Dr. Juliann Pulse.    REASON FOR CONSULTATION:  Medical issues management and anticoagulation management.   HISTORY OF PRESENT ILLNESS: Mr. Salais is an 81 year old man with history of obesity, alcohol abuse, peripheral neuropathy, chronic atrial fibrillation on Coumadin, and chronic abnormal liver function test, hypertension, sleep apnea, gout, and CHF, who was recently admitted on September 22 and was discharged on October 1 after having abdominal pain because of acute pancreatitis likely due to chronic cholecystitis with gallstone and the patient had initially trial of laparoscopic cholecystectomy, but could not be successfully and so open cholecystectomy was done. There was some duodenal serosal injury at that time and after that the patient was discharged to a rehabilitation center to recover.  The patient stayed over there for the last 5-6 days, but continued to have secretions and drainage from his surgical wound and had some low-grade fever so finally the rehabilitation center sent him back to the Emergency Room for a complaint of wound infection and he is admitted to surgical team for management of his surgical wound infection. Medical consult is called in to manage his other medical issues while he is in the hospital.   REVIEW OF SYSTEMS: CONSTITUTIONAL: Negative for fatigue or generalized weakness, but positive for mild fever. He denies any weight gain or weight loss.  EYES: No blurring, double vision, discharge or redness.  EARS, NOSE, THROAT: No tinnitus, ear pain, or hearing loss.  RESPIRATORY: No cough, wheezing, hemoptysis, or shortness of breath.  CARDIOVASCULAR: No chest pain, orthopnea, edema, arrhythmia, or palpitations.  GASTROINTESTINAL: The patient has had recent gallbladder  surgery and having that surgical wound which is painful, but able to tolerate diet.  GENITOURINARY: No dysuria, hematuria, increased frequency.  ENDOCRINE: No heat or cold intolerance. No excessive sweating.  SKIN: No acne, rashes, or lesions.  MUSCULOSKELETAL: No pain or swelling in the joints.  NEUROLOGICAL: No numbness, weakness, tremor, or vertigo.    PSYCHIATRIC: No anxiety, insomnia, bipolar disorder.   PAST MEDICAL HISTORY:  1. Obesity.  2. Alcohol abuse.  3. Peripheral neuropathy.  4. Chronic atrial fibrillation on Coumadin.  5. History of abnormal liver function test.  6. Hypertension.  7. Sleep apnea  8. Gout.  9. History of CHF.   10. Sinus surgery.  11. Bilateral knee surgery with knee replacement.  12. Recent open cholecystectomy   SOCIAL HISTORY: Initially he was living at home with wife, but after the surgery he was sent to rehabilitation. Denies alcohol.  Was smoking 2 cigarettes per day before admission the last time.   FAMILY HISTORY: Positive for lung cancer.   MEDICATIONS BEFORE ADMISSION:  1.  Warfarin 3 mg once a day in the evening except Monday and Friday and on Monday and Friday he takes 4.5 mg  2.  Levaquin 500 mg oral every 24 hours for 7 days.   3.  Gabapentin 100 mg oral capsule 2 times a day.  4.  Furosemide 40 mg oral once a day.  5.  Cyanocobalamin 1000 mg oral once a day.  6.  Carvedilol 3.125 mg 2 times a day.  7.  Allopurinol 300 mg once a day.  8.  Acetaminophen and hydrocodone 325-5 mg oral every 4 hours as needed for pain.   PHYSICAL EXAMINATION:  VITAL SIGNS: Temperature 98, pulse 84,  respirations 18, blood pressure 123/76, and pulse oximetry is 96 on room air.  GENERAL: The patient is fully alert and oriented to time, place, and person. Does not appear in any acute distress.  HEENT: Head and neck atraumatic. Conjunctivae pink. Oral mucosa moist.  NECK: Supple. No JVD.  RESPIRATORY: Bilateral equal and clear air entry.  CARDIOVASCULAR:  S1, S2 present, regular. No murmur.  ABDOMEN: Soft. There is a surgical wound and dressing present on right upper quadrant, tenderness around it and lower abdomen there are like small keyhole surgical wounds present for recent previous laparoscopic trial. Bowel sounds present.  SKIN: No rash.   LEGS: No edema.  JOINTS: No swelling or tenderness.  NEUROLOGICAL: Both lower limbs he is able to move 3 out of 5, power in upper limbs is 5 out of 5. Generalized weakness. No tremor or rigidity.  PSYCHIATRIC: Does not appear in any acute distress.    IMPORTANT LABORATORY RESULTS:  1.  Glucose 97, BUN 19, creatinine 1.29, sodium 137, potassium is 3.5, chloride is 99, CO2 33, calcium is 8.6.  2.  Total protein 5.8, albumin 2.3, bilirubin 0.7, alkaline phosphatase 103, SGOT 18, and SGPT is 22.  3.  WBC is 8.8, hemoglobin 11.5, platelet count is 255,000, and MCV is 104.  4.  INR is 1.3 and prothrombin time is 15.9.   ASSESSMENT AND PLAN: An 81 year old male with a past medical history of peripheral neuropathy, atrial fibrillation on Coumadin, hypertension, sleep apnea, history of congestive heart failure, and recent gallbladder surgery sent to rehabilitation, but sent back here within 5 days because of nonhealing wound and infection. Medical consult for medical issue management.   1.  Surgical wound infection, managed per primary team. He is on IV antibiotics.  2.  Hypertension. Currently blood pressure is under control. We will continue his preadmission medications of carvedilol and furosemide.  3.  Atrial fibrillation on Coumadin. His INR is low, so I will give higher dose 7.5 mg today and will call pharmacy to manage his anticoagulation from tomorrow.  4.  Peripheral neuropathy. We will continue gabapentin.    5.  Gout. We will continue allopurinol.   CODE STATUS: Full code.   TOTAL TIME SPENT ON THIS CONSULT: 50 minutes.   Will continue following with surgical team.       ____________________________ Hope PigeonVaibhavkumar G. Elisabeth PigeonVachhani, MD vgv:bu D: 09/20/2014 17:48:41 ET T: 09/20/2014 18:45:00 ET JOB#: [id number]  cc: Hope PigeonVaibhavkumar G. Elisabeth PigeonVachhani, MD, <Dictator> Altamese DillingVAIBHAVKUMAR Jo Cerone MD ELECTRONICALLY SIGNED 10/07/2014 21:02

## 2015-04-08 NOTE — Consult Note (Signed)
PATIENT NAME:  Joshua Ibarra, WISHAM MR#:  [id number] DATE OF BIRTH:  [birth year]  DATE OF CONSULTATION:  10/04/2014  REQUESTING PHYSICIAN:  Hope Pigeon. Elisabeth Pigeon, MD.  CONSULTING PHYSICIAN:  Stann Mainland. Sampson Goon, MD  REASON FOR CONSULTATION: Sepsis.   HISTORY OF PRESENT ILLNESS: This is a pleasant 81 year old gentleman who was admitted October 19 with sepsis. He had previously had an open cholecystectomy on September 25 for cholelithiasis and gallstone-related pancreatitis. It was an attempted robotically-assisted cholecystectomy but then converted to an open cholecystectomy. This was complicated by a small serosal duodenal tear as it was markedly adherent to the gallbladder. This was repaired with a Cheree Ditto patch. The patient was discharged October 01 on oral levofloxacin. He was readmitted October 06 with purulent drainage from his abdominal wound. He had a wound VAC placed and was treated with IV antibiotics. He was discharged on October 13 on levofloxacin with the wound VAC in place. However, he was readmitted October 19 with confusion, lethargy, fever, hypotension and a white count of 24,000, lactic acid of 7.0.   Since admission, the patient has been in the intensive care unit on pressors. He has been fluid resuscitated. He has been on IV antibiotics. CT scan shows a large rectus sheath fluid collection. He continues to be quite tender at the site. His wound has some decent granulation tissue and does not appear overwhelmingly infected.   PAST MEDICAL HISTORY:  1.  Recent pancreatitis from gallstones and cholecystitis.  2.  History of atrial fibrillation, on Coumadin.  3.  Hypertension.  4.  Sleep apnea.  5.  Gout.  6.  CHF.  7.  Peripheral neuropathy.  8.  Obesity.  9.  Alcohol abuse.   PAST SURGICAL HISTORY: Cholecystectomy, as above; bilateral knee replacements with knee surgery.   SOCIAL HISTORY: He has a history of alcohol abuse and smoking. He is at a nursing home currently but prior  to that he lived with his wife.   FAMILY HISTORY: Noncontributory.   REVIEW OF SYSTEMS:  Eleven systems reviewed and negative except as per HPI.  ALLERGIES: He has no allergies.   ANTIBIOTICS SINCE ADMISSION INCLUDE: Vancomycin and Zosyn. He was admitted and he was at that time on levofloxacin.    PHYSICAL EXAMINATION:  VITAL SIGNS: Temperature 97.9, pulse 97, down from 120 on admission, blood pressure 124/80 on vasopressin and norepinephrine, respiratory rate of 20, saturation 98% on room air.  GENERAL: He is obese. He is awake, but ill-appearing.  HEENT: Pupils equal, round, reactive to light and accommodation. Sclerae are anicteric.  OROPHARYNX: Mucous membranes dry.  NECK: Supple.  HEART: Irregular rhythm but regular rate.  LUNGS: Coarse breath sounds bilaterally.  ABDOMEN: Distended. There is marked tenderness in right flank area where there is firmness consistent with the fluid collection. There is no drainage from the site but there is an opening over it. He has in his gallbladder area an open incision which is quite deep but has some granulation tissue and really no purulence.  EXTREMITIES: No clubbing, cyanosis or edema. He has in his right shin an IO catheter that was placed for fluid resuscitation.   LABORATORY DATA:  Imaging: CT of the abdomen October 20 showed postoperative changes consistent with recent cholecystectomy. No abscess in the gallbladder fossa. There is a large right rectus sheath hematoma. There are inflammatory changes around the descending and sigmoid colon likely representing infectious or inflammatory colitis.   White blood count October 19 was 24.7, currently it is 19.8, hemoglobin 8.8,  platelets of 205,000. Renal function shows a creatinine of 2.96. LFTs October 19 showed an albumin of 2.7, total bilirubin 1.1, alkaline phosphatase 122, AST 44, ALT 20.    Microbiology: Blood cultures from October 19 are growing, in 1 of 2, gram-positive cocci in chains.  Clostridium difficile is negative on October 19 and also October 06.  Blood culture October 06 was negative. I do not see any cultures from his wound but there is a report somewhere that he had MRSA wound infection.   IMPRESSION: An 81 year old gentleman with history of multiple medical problems, admitted with sepsis. He had an open cholecystectomy for gallstone pancreatitis and cholecystitis approximately 1 month ago.  This was complicated by an open wound and wound infection. He also now has a large rectus sheath fluid collection, which is quite tender and is potentially an infected abscess. He has bacteremia with 1 of 2 blood cultures growing gram-positive cocci in chains.  He is currently on vancomycin and Zosyn and is on vasopressin and Levophed.   RECOMMENDATIONS:  1.  Continue current antibiotics.  2.  I agree with IR assessment to drain the fluid collection. We will need to identify the bacteria for further antibiotic tailoring.  3.  Continue supportive care.   Thank you for this consult. I will be glad to follow with you.     ____________________________ Stann Mainlandavid P. Sampson GoonFitzgerald, MD dpf:lt D: 10/04/2014 15:21:04 ET T: 10/04/2014 15:52:46 ET JOB#: [id number]  cc: Stann Mainlandavid P. Sampson GoonFitzgerald, MD, <Dictator> Makani Seckman Sampson GoonFITZGERALD MD ELECTRONICALLY SIGNED 10/11/2014 9:55

## 2015-04-08 NOTE — H&P (Signed)
PATIENT NAME:  Joshua Ibarra, Joshua Ibarra MR#:  [id number] DATE OF BIRTH:  1931/11/21  DATE OF ADMISSION:  09/06/2014  ADMITTING DIAGNOSES:  1.  Abdominal pain due to acute pancreatitis as a result of likely chronic cholecystitis with gallstones.  2.  Hematuria.  3.  Possible granulomatous cholecystitis status post open cholecystectomy with a Cheree DittoGraham patch repair of the duodenal serosal injury.  4.  Acute congestive heart failure, type unknown.  5.  Possible pneumonia.  6.  Wound infection.  7.  Obesity.  8.  History of alcohol abuse.  9.  History of peripheral neuropathy.  10.   Chronic atrial fibrillation.  11.   History of abnormal LFTs.  12.   Hypertension.  13.   Sleep apnea, unable to tolerate CPAP.  14.   Gout.  15.   Status post sinus surgery.  16.   Status post bilateral knee surgery with knee replacement.   CONSULTANTS: Dr. Egbert GaribaldiBird and Dr. Michela PitcherEly.   PERTINENT LABORATORIES AND EVALUATIONS: Ultrasound of the abdomen shows contracted gallbladder with multiple gallstones, hepatic increase echogenicity suggestive of steatosis without any intrahepatic ductal dilation. Urinalysis was negative for blood. Troponin less than 0.02. WBC count 7.1, hemoglobin 12.3, platelet count was 138,000. Glucose 98, BUN 21, creatinine 1.38, bilirubin 1.9, alkaline phosphatase 194, AST 160, ALT 83, albumin 2.7, lipase 2011. INR is 1.7. Urine cultures 2000 CFUs of gram-positive cocci. Ultrasound of the abdomen showed contracted gallbladder with multiple internal probable stones, largest 9 mm.   HOSPITAL COURSE: Please refer to H and P done by the admitting physician. The patient is an 81 year old white male with multiple medical problems who presented with abdominal pain. He was noted to have a very high lipase on admission and was thought to have acute pancreatitis. Ultrasound of the abdomen showed that he likely had gallstone pancreatitis. The patient was kept n.p.o. and was seen by surgery. Finally, he was taken to the OR  on 09/25. The patient had significant evidence of severe chronic cholecystitis with possible granulomatous cholecystitis. He underwent an open cholecystectomy because attempted robotic-assisted laparoscopic cholecystectomy failed. The patient underwent surgery and required a few days for him to recover. He had a small bowel obstruction and required NG tube placement with subsequent removal. The patient also was noted to have fluid overload with acute CHF and was given IV Lasix with improvement in his symptoms. He also continued to complain of cough. There was some concern about pneumonia. He was treated with antibiotics with improvement in his symptoms. The patients breathing is much improved. His GI symptoms have improved. He does have a wound infection that surgery is looking at. The patient at this point is stable for discharge.   DISCHARGE MEDICATIONS: Allopurinol 300 daily, gabapentin 100 mg 1 tab p.o. b.i.d., cyanocobalamin 1000 mcg 1 tablet p.o. daily, carvedilol 3.125 one tab p.o. b.i.d., warfarin 3 mg 1 tab on Tuesday, Wednesday, Thursday, Saturday, Sunday, warfarin 3 mg once a day on Monday and Friday, Lasix 40 daily, acetaminophen hydrocodone 325/5 mg 1 tab p.o. q. 4 hours p.Ibarra.n., Levaquin 500 mg 1 tab p.o. q. 24 x 9 days.   DIET: Low-sodium, low-fat, low-cholesterol.   ACTIVITY: As tolerated, PT evaluation and treatment.   FOLLOWUP: With Dr. Egbert GaribaldiBird of surgery next week to evaluate the wound. Follow up with the nursing home physician in 1 to 2 weeks. Check a CBC and INR on Monday.   TIME SPENT ON THIS PATIENT: 35 minutes.    ____________________________ Lacie ScottsShreyang H. Allena KatzPatel, MD shp:at  D: 09/15/2014 11:27:44 ET T: 09/15/2014 12:10:09 ET JOB#: [id number]  cc: Cyndi Montejano H. Allena Katz, MD, <Dictator>

## 2015-04-08 NOTE — Consult Note (Signed)
Brief Consult Note: Diagnosis: wound infection, Anticoagulant managment.   Patient was seen by consultant.   Consult note dictated.   Orders entered.   Comments: will continue to follow.  Electronic Signatures: Altamese DillingVachhani, Ian Castagna (MD)  (Signed 06-Oct-15 16:17)  Authored: Brief Consult Note   Last Updated: 06-Oct-15 16:17 by Altamese DillingVachhani, Gilliam Hawkes (MD)

## 2015-04-08 NOTE — Consult Note (Signed)
PATIENT NAME:  Joshua Ibarra, [name] MR#:  [id number] DATE OF BIRTH:  1931/07/30  DATE OF CONSULTATION:  09/06/2014  CHIEF COMPLAINT: Abdominal pain, which is now completely resolved.  HISTORY OF PRESENT ILLNESS: This is a patient who describes epigastric pain and some back pain that started 2 or 3 days ago, but is now completely resolved. He has no pain whatsoever. His nausea has resolved as well. His work-up in the hospital has suggested biliary versus alcoholic pancreatitis. I was asked to see the patient for these findings.   PAST MEDICAL HISTORY: Atrial fibrillation, hypertension, peripheral neuropathy, sleep apnea, gout, congestive heart failure.   PAST SURGICAL HISTORY: Bilateral knee surgery, and sinus surgery. No abdominal surgery.   ALLERGIES: None.   MEDICATIONS: Multiple, including Coumadin.   FAMILY HISTORY: Noncontributory.   SOCIAL HISTORY: The patient is a retired Forensic scientistchemical engineer smokes two cigars per day, drinks 4 to 6 ounces of a hard alcohol per day.   REVIEW OF SYSTEMS: A 10 system review is performed and negative with the exception of that mentioned in the history of present illness.   PHYSICAL EXAMINATION:  GENERAL: Healthy-appearing male patient.  HEENT: No scleral icterus.  NECK: No palpable neck nodes.  CHEST: Clear to auscultation.  CARDIAC: Irregular in rate and rhythm.  ABDOMEN: Soft, nondistended, and nontender. Negative Murphy's sign.  EXTREMITIES: Without edema.  NEUROLOGIC: Grossly intact.  INTEGUMENT: No jaundice.   An ultrasound shows stones in a contracted gallbladder. White blood cell count is 7, hemoglobin and hematocrit of 12 and 38 with a platelet count of 138. Bilirubin is 1.9, alkaline phosphatase 194, AST and ALT of 83 and 160 respectively, creatinine 1.38 and a lipase of 2000.   ASSESSMENT AND PLAN: This is a patient with likely biliary pancreatitis. I doubt that this is related to alcohol abuse, but it is certainly a consideration. My plan  would be to repeat his liver function tests and lipase in the morning. If his labs returned to normal promptly with his resolution of pain than laparoscopic cholecystectomy with cholangiography could be performed at this hospitalization provided his platelet count and INR are corrected. Additional if he were to worsen then ERCP or at least GI consultation for consideration of ERCP should be included in his work-up as well a CT scan to assess the degree of pancreatic inflammation which the ultrasound cannot provide. We will be happy to follow this patient while he is in the hospital and consider surgical intervention if necessary.     ____________________________ Adah Salvageichard E. Excell Seltzerooper, MD rec:JT D: 09/06/2014 22:30:50 ET T: 09/06/2014 23:01:31 ET JOB#: [id number]  cc: Adah Salvageichard E. Excell Seltzerooper, MD, <Dictator> Lattie HawICHARD E COOPER MD ELECTRONICALLY SIGNED 09/09/2014 6:51

## 2015-04-08 NOTE — Discharge Summary (Signed)
PATIENT NAME:  Joshua Ibarra, MIMBS MR#:  [id number] DATE OF BIRTH:  [birth year]  DATE OF ADMISSION:  09/06/2014 DATE OF DISCHARGE:  09/15/2014  ADMITTING DIAGNOSES:  1. Abdominal pain due to acute pancreatitis as a result of likely chronic cholecystitis with gallstones.  2. Hematuria.  3. 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  4. Acute congestive heart failure, type unknown.  5. Possible pneumonia.  6. Wound infection.  7. Obesity.  8. History of alcohol abuse.  9. History of peripheral neuropathy.  10. Chronic atrial fibrillation.  11. History of abnormal LFTs.  12. Hypertension.  13. Sleep apnea, unable to tolerate CPAP.  14. Gout.  15. Status post sinus surgery.  16. Status post bilateral knee surgery with knee replacement.   CONSULTANTS: Dr. Egbert Garibaldi and Dr. Michela Pitcher.   PERTINENT LABORATORIES AND EVALUATIONS: Ultrasound of the abdomen shows contracted gallbladder with multiple gallstones, hepatic increase echogenicity suggestive of steatosis without any intrahepatic ductal dilation. Urinalysis was negative for blood. Troponin less than 0.02. WBC count 7.1, hemoglobin 12.3, platelet count was 138,000. Glucose 98, BUN 21, creatinine 1.38, bilirubin 1.9, alkaline phosphatase 194, AST 160, ALT 83, albumin 2.7, lipase 2011. INR is 1.7. Urine cultures 2000 CFUs of gram-positive cocci. Ultrasound of the abdomen showed contracted gallbladder with multiple internal probable stones, largest 9 mm.   HOSPITAL COURSE: Please refer to H and P done by the admitting physician. The patient is an 81 year old white male with multiple medical problems who presented with abdominal pain. He was noted to have a very high lipase on admission and was thought to have acute pancreatitis. Ultrasound of the abdomen showed that he likely had gallstone pancreatitis. The patient was kept n.p.o. and was seen by surgery. Finally, he was taken  to the OR on 09/25. The patient had significant evidence of severe chronic cholecystitis with possible granulomatous cholecystitis. He underwent an open cholecystectomy because attempted robotic-assisted laparoscopic cholecystectomy failed. The patient underwent surgery and required a few days for him to recover. He had a small bowel obstruction and required NG tube placement with subsequent removal. The patient also was noted to have fluid overload with acute CHF and was given IV Lasix with improvement in his symptoms. He also continued to complain of cough. There was some concern about pneumonia. He was treated with antibiotics with improvement in his symptoms. The patient's breathing is much improved. His GI symptoms have improved. He does have a wound infection that surgery is looking at. The patient at this point is stable for discharge.   DISCHARGE MEDICATIONS: Allopurinol 300 daily, gabapentin 100 mg 1 tab p.o. b.i.d., cyanocobalamin 1000 mcg 1 tablet p.o. daily, carvedilol 3.125 one tab p.o. b.i.d., warfarin 3 mg 1 tab on Tuesday, Wednesday, Thursday, Saturday, Sunday, warfarin 3 mg once a day on Monday and Friday, Lasix 40 daily, acetaminophen hydrocodone 325/5 mg 1 tab p.o. q. 4 hours p.r.n., Levaquin 500 mg 1 tab p.o. q. 24 x 9 days.   DIET: Low-sodium, low-fat, low-cholesterol.   ACTIVITY: As tolerated, PT evaluation and treatment.   FOLLOWUP: With Dr. Egbert Garibaldi of surgery next week to evaluate the wound. Follow up with the nursing home physician in 1 to 2 weeks. Check a CBC and INR on Monday.   TIME SPENT ON THIS PATIENT: 35 minutes.   ____________________________ Lacie Scotts Allena Katz, MD shp:at D: 09/15/2014 11:27:00 ET T: 09/15/2014 12:10:09 ET JOB#: [id number]  cc: Jose Corvin H. Allena Katz, MD, <Dictator> [facility] H  Bristyl Mclees MD ELECTRONICALLY SIGNED 09/25/2014 8:54

## 2015-04-08 NOTE — H&P (Signed)
PATIENT NAME:  CLARA, SMOLEN MR#:  [id number] DATE OF BIRTH:  10-16-1931  DATE OF ADMISSION:  03/01/2014  REFERRING PHYSICIAN:    PRIMARY CARE PHYSICIAN:  Dr. Ilene Qua.  CHIEF COMPLAINT: Shortness of breath and weakness.   An 81 year old Caucasian gentleman with past medical history of diastolic congestive heart failure, hypertension, Afib, COPD, who was previously on home O2, who is presenting with weakness. He describes 5 month duration of dyspnea on exertion; however, he has been progressively worsening now and has 2 to 3 day duration and has been much worse. He has now unable to get out of bed, has fallen multiple times in the last 2 days without any head trauma or loss of consciousness. As far as associated symptoms, he does note having lower extremity edema, which is, however, chronic and somewhat improved from baseline. Denies any orthopnea. He is currently lying flat without symptoms. Denies any PND. Denies fevers, chills, chest pain, palpitations or further symptomatology.     REVIEW OF SYSTEMS:   CONSTITUTIONAL: Generalized fatigue and weakness. Denies fever, chills.  EYES: Denies blurred vision, double vision, eye pain.  HENT: Denies tinnitus, ear pain, hearing loss. RESPIRATORY:  Denies cough, wheeze. Positive for shortness of breath. Has dyspnea on exertion.  CARDIOVASCULAR: Denies chest pain, palpitations. Positive for edema. Denies any orthopnea or PND.  GASTROINTESTINAL: Denies nausea, vomiting, diarrhea, abdominal pain.  GENITOURINARY: Denies dysuria, hematuria.  ENDOCRINE: Denies nocturia or thyroid denies.  HEMATOLOGIC AND LYMPHATICS:  Positive for easy bruising. Denies bleeding.  SKIN: Denies rash or lesion.  MUSCULOSKELETAL: Denies pain in neck, back, shoulder, knees, hips or arthritic symptoms.  NEUROLOGIC: Denies paralysis, paresthesias.  PSYCHIATRIC:  Denies anxiety or depressive symptoms. Otherwise, full review of systems performed by me is negative.  PAST MEDICAL  HISTORY: Hypertension, obstructive sleep apnea on CPAP therapy, atrial fibrillation on warfarin for anticoagulation, diastolic congestive heart failure, COPD, previously on 2 liters nasal cannula at baseline.   SOCIAL HISTORY: Positive for occasional cigar use. Positive for alcohol usage daily, drinking approximately 1/2 to 1 pint of hard alcohol.   FAMILY HISTORY: Positive for lung cancer.   ALLERGIES: No known drug allergies.   HOME MEDICATIONS: Include warfarin 4 mg p.o. daily, gabapentin 100 mg p.o. b.i.d., allopurinol 300 mg p.o. daily, Coreg 12.5 mg p.o. b.i.d., Lasix 40 mg p.o. daily.   PHYSICAL EXAMINATION: VITAL SIGNS: Temperature 98.2, heart rate 74, respirations 20, blood pressure 136/75, saturating 96% on room air. Weight 120.8 kg, BMI 40.8.  GENERAL: Chronically ill-appearing Caucasian gentleman, currently in no acute distress.  HEAD: Normocephalic, atraumatic.  EYES: Pupils equal, round and reactive to light. EOM intact. No scleral icterus. MOUTH:  Moist mucous membranes.  Dentition intact. No abscess noted. EAR, NOSE, THROAT:  Clear without exudates. No external lesions. NECK: Supple.  No thyromegaly. No nodules. No JVD.  PULMONARY: Clear to auscultation bilaterally without wheeze, rales or rhonchi. No use of accessory muscles. Poor respiratory effort.  CHEST: Nontender to palpation.  CARDIOVASCULAR: S1, S2, irregular rate, irregular rhythm. No murmurs, rubs or gallops, 2+ edema to knees bilaterally. Pedal pulses 2+ bilaterally.    GASTROINTESTINAL: Soft, nontender, nondistended. No masses. Positive bowel sounds. No hepatosplenomegaly.  MUSCULOSKELETAL: No swelling, clubbing. Positive for edema as described above. Range of motion full in all extremities.  NEUROLOGIC: Cranial nerves II through XII intact. No gross focal neurological deficits. Sensation intact.  Reflexes intact.  SKIN:  Changes on the lower extremities bilaterally consistent with chronic venous stasis,  surrounding erythema and thickening.  No further ulcerations, lesions, rash or cyanosis. Skin warm and dry. Turgor intact.  PSYCHIATRIC:  Mood and affect within normal limits.  The patient is awake, alert and oriented x 3.  Insight and judgment intact.   LABORATORY DATA: Chest x-ray performed revealing stable cardiomegaly. No acute cardiopulmonary process. No pulmonary edema. Sodium 136, potassium 3.6, chloride 99, bicarb 32, BUN 23, creatinine 1.23, glucose 91. Total protein 6.8, albumin 3.1, bilirubin 1.1, alk phos  169, AST 50, ALT 55. Troponin I less than 0.02. INR 4.9. WBC 6, hemoglobin 12.3, platelets of 134.   ASSESSMENT AND PLAN: An 81 year old Caucasian gentleman with history of diastolic congestive heart failure, chronic obstructive pulmonary disease and atrial fib, presenting with shortness of breath and weakness.  1.  Weakness. This is likely multifactorial with his lower extremity edema, deconditioning  and dyspnea on exertion all contributing. We will request a physical therapy evaluation. Change Lasix to IV to hopefully decrease the leg edema.  2.  Shortness of breath, also multifactorial. Shortness of breath started at the exact same time as he self-weaned from supplemental O2.  He should have an O2 script prior to discharge to see if actually requires any oxygen therapy.DuoNeb therapies q.4 hours while awake. Currently, he is saturating well on room air. We will also add incentive spirometry.  3. Atrial fibrillation with supratherapeutic INR. We will hold warfarin therapy. He is currently rate controlled.  4.  Alcohol abuse. Given the amount of alcohol he drinks and his last drink was greater than 24 hours ago, we will initiate CIWA protocol.  5.  Venous thromboembolism prophylaxis. No indication for heparin, as he is currently supratherapeutic on warfarin.  6.  FULL CODE.   TIME SPENT: 45 minutes.    ____________________________ Aaron Mose. Jaquilla Woodroof, MD dkh:dmm D: 03/01/2014 22:17:00  ET T: 03/01/2014 22:46:09 ET JOB#: [id number]  cc: Aaron Mose. Jerri Glauser, MD, <Dictator> Kierah Goatley Woodfin Ganja MD ELECTRONICALLY SIGNED 03/10/2014 0:39

## 2015-04-08 NOTE — Consult Note (Signed)
   Comments   Billey Chang, NP, and I met with pt's wife, son and daughter. Updated them on pt's current status and plan for aspiration of ? abscess in rectus sheath.  says that pt has a long h/o respiratory failure dating back to 2007. They say he has been worked up by multiple physicians without an etiology. Pt has been told to wear 02 at home but refuses as he says it does not help him. Pt has an ECHO dated 08/10/11 with severely dilated RV and RVSP 40-5-mmHg. Suspect he has pulmonary HTN secondary to COPD (pt is long time smoker) or OSA. Discussed with Dr Anselm Jungling who will order ECHO.  follow pt during hospitalization.   Electronic Signatures: Jaelynn Currier, Izora Gala (MD)  (Signed 20-Oct-15 16:23)  Authored: Palliative Care   Last Updated: 20-Oct-15 16:23 by Yumna Ebers, Izora Gala (MD)
